# Patient Record
Sex: Male | Born: 1985 | Race: Black or African American | Hispanic: No
Health system: Southern US, Community
[De-identification: ages and names within clinical notes are randomized; demographics above are authoritative.]

## PROBLEM LIST (undated history)

## (undated) ENCOUNTER — Emergency Department (HOSPITAL_COMMUNITY): Payer: Self-pay | Source: Home / Self Care

## (undated) DIAGNOSIS — A64 Unspecified sexually transmitted disease: Secondary | ICD-10-CM

## (undated) HISTORY — PX: OTHER SURGICAL HISTORY: SHX169

## (undated) HISTORY — PX: MR LOWER LEG RIGHT (ARMC HX): HXRAD1785

---

## 2008-12-09 ENCOUNTER — Emergency Department (HOSPITAL_COMMUNITY): Admission: EM | Admit: 2008-12-09 | Discharge: 2008-12-09 | Payer: Self-pay | Admitting: Emergency Medicine

## 2010-06-12 ENCOUNTER — Emergency Department (HOSPITAL_COMMUNITY)
Admission: EM | Admit: 2010-06-12 | Discharge: 2010-06-12 | Payer: Self-pay | Source: Home / Self Care | Admitting: Emergency Medicine

## 2010-06-14 LAB — GC/CHLAMYDIA PROBE AMP, GENITAL
Chlamydia, DNA Probe: POSITIVE — AB
GC Probe Amp, Genital: POSITIVE — AB

## 2010-09-17 ENCOUNTER — Emergency Department (HOSPITAL_COMMUNITY)
Admission: EM | Admit: 2010-09-17 | Discharge: 2010-09-17 | Disposition: A | Discharge status: Home / Self Care | Payer: Self-pay | Attending: Emergency Medicine | Admitting: Emergency Medicine

## 2010-09-17 DIAGNOSIS — R51 Headache: Secondary | ICD-10-CM | POA: Insufficient documentation

## 2010-09-17 DIAGNOSIS — R0602 Shortness of breath: Secondary | ICD-10-CM | POA: Insufficient documentation

## 2010-09-17 DIAGNOSIS — R0989 Other specified symptoms and signs involving the circulatory and respiratory systems: Secondary | ICD-10-CM | POA: Insufficient documentation

## 2010-09-17 DIAGNOSIS — R0609 Other forms of dyspnea: Secondary | ICD-10-CM | POA: Insufficient documentation

## 2010-09-17 LAB — BASIC METABOLIC PANEL
BUN: 6 mg/dL (ref 6–23)
GFR calc Af Amer: >60 mL/min (ref >60)
GFR calc non Af Amer: >60 mL/min (ref >60)
Potassium: 3.8 mEq/L (ref 3.5–5.1)
Sodium: 135 mEq/L (ref 135–145)

## 2010-09-17 LAB — CBC
MCV: 91.2 fL (ref 78.0–100.0)
Platelets: 247 10*3/uL (ref 150–400)
RDW: 13.1 % (ref 11.5–15.5)
WBC: 9.2 10*3/uL (ref 4.0–10.5)

## 2010-09-17 LAB — DIFFERENTIAL
Basophils Absolute: 0 10*3/uL (ref 0.0–0.1)
Basophils Relative: 0 % (ref 0–1)
Eosinophils Absolute: 0.5 10*3/uL (ref 0.0–0.7)
Eosinophils Relative: 6 % — ABNORMAL HIGH (ref 0–5)

## 2012-01-27 ENCOUNTER — Encounter (HOSPITAL_COMMUNITY): Payer: Self-pay | Admitting: Emergency Medicine

## 2012-01-27 ENCOUNTER — Emergency Department (HOSPITAL_COMMUNITY)
Admission: EM | Admit: 2012-01-27 | Discharge: 2012-01-27 | Disposition: A | Discharge status: Home / Self Care | Payer: Self-pay | Attending: Emergency Medicine | Admitting: Emergency Medicine

## 2012-01-27 DIAGNOSIS — N342 Other urethritis: Secondary | ICD-10-CM | POA: Insufficient documentation

## 2012-01-27 MED ORDER — CEFTRIAXONE SODIUM 250 MG IJ SOLR
250.0000 mg | Freq: Once | INTRAMUSCULAR | Status: AC
  Administered 2012-01-27: 250 mg via INTRAMUSCULAR
  Filled 2012-01-27 (×2): qty 250

## 2012-01-27 MED ORDER — AZITHROMYCIN 1 G PO PACK
2.0000 g | PACK | Freq: Once | ORAL | Status: AC
  Administered 2012-01-27: 1 g via ORAL
  Filled 2012-01-27: qty 2

## 2012-01-27 NOTE — ED Notes (Signed)
Has d/c from penis since this am

## 2012-01-27 NOTE — ED Provider Notes (Signed)
History  This chart was scribed for Jones Skene, MD by Albertha Ghee Rifaie. This patient was seen in room TR09C/TR09C and the patient's care was started at 2:02 PM.  CSN: [id number]  Arrival date & time 01/27/12  1249   None     No chief complaint on file.   The history is provided by the patient. No language interpreter was used.    Jamie Lowe is a 26 y.o. male who presents to the Emergency Department asking for a STD test. He states that he had sexual intercourse with his friend last night and discovered that he had a penile discharge described as white associated with dysuria this morning. He denies testicular pain, fever, chills, diarrhea, cough, SOB, HA, and CP. He does not have a h/o chronic medical conditions. Pt is a current everyday smoker and occasional alcohol user.   History reviewed. No pertinent past medical history.  No past surgical history on file.  No family history on file.  History  Substance Use Topics  . Smoking status: Current Every Day Smoker  . Smokeless tobacco: Not on file  . Alcohol Use: Yes      Review of Systems  REVIEW OF SYSTEMS:   1.) CONSTITUTIONAL: No fever, chills or systemic signs of infection. No recent, unexplained weight changes.   2.) HEENT: No facial pain, sinus congestion or rhinorrhea is reported. Patient is denying any acute visual or hearing deficits. No sore throat or difficulty swallowing.  3.) NECK: No swelling or masses are reported.   4.) PULMONARY: No cough sputum production or shortness of breath was reported.   5.) CARDIAC: No palpitations, chest pain or pressure.   6.) ABDOMINAL: Denies abdominal pain, nausea, vomiting or diarrhea. No Hematochezia or melena.  7.) GENITOURINARY: Positive for dysuria and penile discharge.  8.) BACK: Denying any flank or CVA tenderness. No specific thoracic or lumbar pain.   9.) EXTREMITIES: Denying any extremity edema pitting or rash.   10.) SKIN: No rashes,  itching   Allergies  Review of patient's allergies indicates no known allergies.  Home Medications  No current outpatient prescriptions on file.  Triage Vitals: BP 130/83  Pulse 95  Temp 98.6 F (37 C)  Resp 16  SpO2 99%  Physical Exam   Nursing notes reviewed.  Electronic medical record reviewed. VITAL SIGNS:   Filed Vitals:   01/27/12 1255  BP: 130/83  Pulse: 95  Temp: 98.6 F (37 C)  Resp: 16  SpO2: 99%   CONSTITUTIONAL: Awake, oriented, appears non-toxic HENT: Atraumatic, normocephalic, oral mucosa pink and moist, airway patent. Nares patent without drainage. External ears normal. EYES: Conjunctiva clear, EOMI, PERRLA NECK: Trachea midline, non-tender, supple CARDIOVASCULAR: Normal heart rate, Normal rhythm, No murmurs, rubs, gallops PULMONARY/CHEST: Clear to auscultation, no rhonchi, wheezes, or rales. Symmetrical breath sounds. Non-tender. ABDOMINAL: Non-distended, soft, non-tender - no rebound or guarding.  BS normal. NEUROLOGIC: Non-focal, moving all four extremities, no gross sensory or motor deficits. EXTREMITIES: No clubbing, cyanosis, or edema SKIN: Warm, Dry, No erythema, No rash GU: Normal circumcised male, no drainage appreciated. No TTP of testicles, penis. No hernias. No tender LAD. No skin lesions.  ED Course  Procedures (including critical care time)  DIAGNOSTIC STUDIES: Oxygen Saturation is 99% on room air, normal by my interpretation.    COORDINATION OF CARE: 2:14PM-Discussed treatment plan with pt at bedside and pt agreed to plan.    Labs Reviewed - No data to display No results found.   No diagnosis found.  MDM  Jamie Lowe is a 26 y.o. male presenting with DC.  No torsion, epidydimitis, suspected.  Likely urethritis from STI.  Will treat for GC/Chlamydia and send sample for both. .I explained the diagnosis and have given explicit precautions to return to the ER including any other new or worsening symptoms. The patient  understands and accepts the medical plan as it's been dictated and I have answered their questions. Discharge instructions concerning home care and prescriptions have been given.  The patient is STABLE and is discharged to home in good condition.    I personally performed the services described in this documentation, which was scribed in my presence. The recorded information has been reviewed and considered. Jones Skene, M.D.      Jones Skene, MD 01/31/12 1339

## 2012-01-28 LAB — GC/CHLAMYDIA PROBE AMP, GENITAL: GC Probe Amp, Genital: POSITIVE — AB

## 2012-01-29 NOTE — ED Notes (Signed)
Attempted to contact patient. Phone number has been disconnected. Will send letter.

## 2012-01-29 NOTE — ED Notes (Signed)
+  Gonorrhea. Patient treated with Rocephin and Zithromax. Per protocol MD. [facility] faxed. °

## 2012-06-14 ENCOUNTER — Emergency Department (HOSPITAL_COMMUNITY)
Admission: EM | Admit: 2012-06-14 | Discharge: 2012-06-14 | Disposition: A | Discharge status: Home / Self Care | Payer: Self-pay | Attending: Emergency Medicine | Admitting: Emergency Medicine

## 2012-06-14 ENCOUNTER — Encounter (HOSPITAL_COMMUNITY): Payer: Self-pay

## 2012-06-14 DIAGNOSIS — M7650 Patellar tendinitis, unspecified knee: Secondary | ICD-10-CM | POA: Insufficient documentation

## 2012-06-14 DIAGNOSIS — M25569 Pain in unspecified knee: Secondary | ICD-10-CM | POA: Insufficient documentation

## 2012-06-14 DIAGNOSIS — F17200 Nicotine dependence, unspecified, uncomplicated: Secondary | ICD-10-CM | POA: Insufficient documentation

## 2012-06-14 DIAGNOSIS — M2550 Pain in unspecified joint: Secondary | ICD-10-CM | POA: Insufficient documentation

## 2012-06-14 DIAGNOSIS — N390 Urinary tract infection, site not specified: Secondary | ICD-10-CM | POA: Insufficient documentation

## 2012-06-14 DIAGNOSIS — R369 Urethral discharge, unspecified: Secondary | ICD-10-CM | POA: Insufficient documentation

## 2012-06-14 DIAGNOSIS — R300 Dysuria: Secondary | ICD-10-CM | POA: Insufficient documentation

## 2012-06-14 LAB — URINE MICROSCOPIC-ADD ON

## 2012-06-14 LAB — URINALYSIS, ROUTINE W REFLEX MICROSCOPIC
Ketones, ur: 15 mg/dL — AB
Nitrite: NEGATIVE
pH: 6 (ref 5.0–8.0)

## 2012-06-14 MED ORDER — HYDROCODONE-ACETAMINOPHEN 5-325 MG PO TABS: 1.0000 | ORAL_TABLET | ORAL | Status: DC | PRN

## 2012-06-14 MED ORDER — IBUPROFEN 800 MG PO TABS: 800.0000 mg | ORAL_TABLET | Freq: Three times a day (TID) | ORAL | Status: DC | PRN

## 2012-06-14 MED ORDER — LIDOCAINE HCL (PF) 1 % IJ SOLN
INTRAMUSCULAR | Status: AC
  Administered 2012-06-14: 1 mL
  Filled 2012-06-14: qty 5

## 2012-06-14 MED ORDER — CEFTRIAXONE SODIUM 250 MG IJ SOLR
250.0000 mg | Freq: Once | INTRAMUSCULAR | Status: AC
  Administered 2012-06-14: 250 mg via INTRAMUSCULAR
  Filled 2012-06-14: qty 250

## 2012-06-14 MED ORDER — HYDROCODONE-ACETAMINOPHEN 5-325 MG PO TABS
1.0000 | ORAL_TABLET | Freq: Once | ORAL | Status: AC
  Administered 2012-06-14: 1 via ORAL
  Filled 2012-06-14: qty 1

## 2012-06-14 MED ORDER — IBUPROFEN 400 MG PO TABS
800.0000 mg | ORAL_TABLET | Freq: Once | ORAL | Status: AC
  Administered 2012-06-14: 800 mg via ORAL
  Filled 2012-06-14: qty 2

## 2012-06-14 MED ORDER — SULFAMETHOXAZOLE-TMP DS 800-160 MG PO TABS: 1.0000 | ORAL_TABLET | Freq: Two times a day (BID) | ORAL | Status: DC

## 2012-06-14 MED ORDER — AZITHROMYCIN 250 MG PO TABS
1000.0000 mg | ORAL_TABLET | Freq: Once | ORAL | Status: AC
  Administered 2012-06-14: 1000 mg via ORAL
  Filled 2012-06-14: qty 4

## 2012-06-14 MED ORDER — SULFAMETHOXAZOLE-TMP DS 800-160 MG PO TABS
1.0000 | ORAL_TABLET | Freq: Once | ORAL | Status: AC
  Administered 2012-06-14: 1 via ORAL
  Filled 2012-06-14: qty 1

## 2012-06-14 NOTE — ED Provider Notes (Signed)
Medical screening examination/treatment/procedure(s) were performed by non-physician practitioner and as supervising physician I was immediately available for consultation/collaboration.   Joban Colledge L Shakelia Scrivner, MD 06/14/12 1810 

## 2012-06-14 NOTE — ED Notes (Signed)
Pt states he was seen at the clinic for discharge about a week or so ago and is still having penile discharge. Thinks maybe didn't get the right antibiotic. Also reports left knee pain with hx of.

## 2012-06-14 NOTE — ED Provider Notes (Signed)
History     CSN: [id number]  Arrival date & time 06/14/12  1246   First MD Initiated Contact with Patient 06/14/12 1336      Chief Complaint  Patient presents with  . Knee Pain  . Penile Discharge    (Consider location/radiation/quality/duration/timing/severity/associated sxs/prior treatment) Patient is a 27 y.o. male presenting with knee pain and penile discharge. The history is provided by the patient.  Knee Pain This is a new problem. The current episode started yesterday. Associated symptoms include arthralgias. Pertinent negatives include no abdominal pain, chills, fever or rash. Associated symptoms comments: Left knee pain without injury since yesterday similar to previous pain in the same knee affecting anterior aspect below the patella..  Penile Discharge This is a new problem. Associated symptoms include arthralgias. Pertinent negatives include no abdominal pain, chills, fever or rash. Associated symptoms comments: Penile discharge associated with urination for the past 2 weeks. [facility]2 [facility]". He continues to have discharge after urinating. He also reports some dribbling after urinating. No burning, abdominal pain, N, or fever. No testicular pain. He denies rectal pain. Marland Kitchen    History reviewed. No pertinent past medical history.  Past Surgical History  Procedure Date  . Knee sugery (left)     Pt states "I think it was a ligament tear"    History reviewed. No pertinent family history.  History  Substance Use Topics  . Smoking status: Current Some Day Smoker    Types: Cigars  . Smokeless tobacco: Not on file  . Alcohol Use: Yes      Review of Systems  Constitutional: Negative for fever and chills.  HENT: Negative.   Respiratory: Negative.   Cardiovascular: Negative.   Gastrointestinal: Negative.  Negative for abdominal pain.  Genitourinary: Positive for discharge and difficulty urinating. Negative for  testicular pain.       See HPI.  Musculoskeletal: Positive for arthralgias.  Skin: Negative.  Negative for rash and wound.  Psychiatric/Behavioral: Negative for confusion.    Allergies  Review of patient's allergies indicates no known allergies.  Home Medications  No current outpatient prescriptions on file.  BP 118/79  Pulse 81  Temp 97.7 F (36.5 C) (Oral)  Resp 20  SpO2 99%  Physical Exam  Constitutional: He is oriented to person, place, and time. He appears well-developed and well-nourished.  Cardiovascular: Intact distal pulses.   Abdominal: Soft. There is no tenderness.  Genitourinary:       No testicular pain or swelling. No scrotal redness. Circumcised penis with minimal white discharge present. Patient deferred rectal exam.   Musculoskeletal:       Left knee: tender to inferior patellar tendon with swelling. No discoloration, palpable abscess, no effusion. Joint stable. No calf or thigh tenderness.   Neurological: He is alert and oriented to person, place, and time.  Skin: Skin is warm and dry. No erythema. No pallor.    ED Course  Procedures (including critical care time)  Labs Reviewed  URINALYSIS, ROUTINE W REFLEX MICROSCOPIC - Abnormal; Notable for the following:    Color, Urine AMBER (*)  BIOCHEMICALS MAY BE AFFECTED BY COLOR   APPearance HAZY (*)     Specific Gravity, Urine 1.034 (*)     Bilirubin Urine SMALL (*)     Ketones, ur 15 (*)     Leukocytes, UA MODERATE (*)     All other components within normal limits  URINE MICROSCOPIC-ADD ON  GC/CHLAMYDIA PROBE  AMP   No results found.   No diagnosis found.  1. Penile discharge 2. uti 3. Left inferior patellar tendonitis  MDM  Cultures pending. Azithromax/rocephin given. Patient started on Septra DS for UTI - possible prostatitis, possible STD. Recommend urology follow up with persistent symptoms. Left knee pain does not reflect arthritis, doubt gonococcal infection, suspect inferior patellar  tendonitis.         Arnoldo Hooker, PA-C 06/14/12 1552

## 2012-06-15 LAB — GC/CHLAMYDIA PROBE AMP: GC Probe RNA: NEGATIVE

## 2013-07-14 ENCOUNTER — Encounter (HOSPITAL_COMMUNITY): Payer: Self-pay | Admitting: Emergency Medicine

## 2013-07-14 ENCOUNTER — Emergency Department (HOSPITAL_COMMUNITY)
Admission: EM | Admit: 2013-07-14 | Discharge: 2013-07-14 | Disposition: A | Discharge status: Home / Self Care | Payer: Self-pay | Attending: Emergency Medicine | Admitting: Emergency Medicine

## 2013-07-14 DIAGNOSIS — F17200 Nicotine dependence, unspecified, uncomplicated: Secondary | ICD-10-CM | POA: Insufficient documentation

## 2013-07-14 DIAGNOSIS — Z792 Long term (current) use of antibiotics: Secondary | ICD-10-CM | POA: Insufficient documentation

## 2013-07-14 DIAGNOSIS — L853 Xerosis cutis: Secondary | ICD-10-CM

## 2013-07-14 DIAGNOSIS — L259 Unspecified contact dermatitis, unspecified cause: Secondary | ICD-10-CM | POA: Insufficient documentation

## 2013-07-14 DIAGNOSIS — R369 Urethral discharge, unspecified: Secondary | ICD-10-CM | POA: Insufficient documentation

## 2013-07-14 DIAGNOSIS — Z8619 Personal history of other infectious and parasitic diseases: Secondary | ICD-10-CM | POA: Insufficient documentation

## 2013-07-14 LAB — RPR: RPR Ser Ql: NONREACTIVE

## 2013-07-14 LAB — HIV ANTIBODY (ROUTINE TESTING W REFLEX): HIV: NONREACTIVE

## 2013-07-14 MED ORDER — CEFTRIAXONE SODIUM 250 MG IJ SOLR
250.0000 mg | Freq: Once | INTRAMUSCULAR | Status: DC
  Filled 2013-07-14: qty 250

## 2013-07-14 MED ORDER — METRONIDAZOLE 500 MG PO TABS
2000.0000 mg | ORAL_TABLET | Freq: Once | ORAL | Status: AC
  Administered 2013-07-14: 2000 mg via ORAL
  Filled 2013-07-14: qty 4

## 2013-07-14 MED ORDER — AZITHROMYCIN 250 MG PO TABS
1000.0000 mg | ORAL_TABLET | Freq: Once | ORAL | Status: AC
  Administered 2013-07-14: 1000 mg via ORAL
  Filled 2013-07-14: qty 4

## 2013-07-14 MED ORDER — CETAPHIL MOISTURIZING EX CREA: TOPICAL_CREAM | CUTANEOUS | Status: DC | PRN

## 2013-07-14 MED ORDER — LIDOCAINE HCL (PF) 1 % IJ SOLN
INTRAMUSCULAR | Status: AC
  Filled 2013-07-14: qty 5

## 2013-07-14 NOTE — ED Notes (Signed)
Pt wanting to be tested for herpes.  PA aware, unable to swab if no lesions.  Pt having no lesions. Pt aware.

## 2013-07-14 NOTE — ED Notes (Signed)
Pt presents to department for evaluation of rash to buttocks and penile discharge. Ongoing for several days. Pt states itching to bilateral buttocks. States he wants to be checked for possible STD.

## 2013-07-14 NOTE — ED Provider Notes (Signed)
CSN: [id number]     Arrival date & time 07/14/13  1259 History  This chart was scribed for non-physician practitioner, Trixie Dredge, PA-C working with Lyanne Co, MD by Greggory Stallion, ED scribe. This patient was seen in room TR11C/TR11C and the patient's care was started at 1:47 PM.   Chief Complaint  Patient presents with  . Rash  . SEXUALLY TRANSMITTED DISEASE   The history is provided by the patient. No language interpreter was used.   HPI Comments: Jamie Lowe is a 28 y.o. male who presents to the Emergency Department complaining of a possible insect bite to his buttocks that he noticed 1.5 weeks ago. He states he now has an itchy rash to his buttocks. Pt has used an OTC cream for insect bites and coco butter with no relief. He is also complaining of clear penile discharge after urination. This began two days ago. Denies fever, abdominal pain, emesis, dysuria, testicular pain, testicular swelling. Pt has had gonorrhea in the past and states his symptoms are not similar. He wants treatment for chlamydia trichomonas.   No past medical history on file. Past Surgical History  Procedure Laterality Date  . Knee sugery (left)      Pt states "I think it was a ligament tear"   No family history on file. History  Substance Use Topics  . Smoking status: Current Some Day Smoker    Types: Cigars  . Smokeless tobacco: Not on file  . Alcohol Use: Yes    Review of Systems  Constitutional: Negative for fever.  Gastrointestinal: Negative for vomiting and abdominal pain.  Genitourinary: Positive for discharge. Negative for dysuria, scrotal swelling and testicular pain.  Skin: Positive for rash.  All other systems reviewed and are negative.   Allergies  Review of patient's allergies indicates no known allergies.  Home Medications   Current Outpatient Rx  Name  Route  Sig  Dispense  Refill  . HYDROcodone-acetaminophen (NORCO/VICODIN) 5-325 MG per tablet   Oral   Take 1 tablet by  mouth every 4 (four) hours as needed for pain.   6 tablet   0   . ibuprofen (ADVIL,MOTRIN) 800 MG tablet   Oral   Take 1 tablet (800 mg total) by mouth every 8 (eight) hours as needed for pain.   21 tablet   0   . sulfamethoxazole-trimethoprim (BACTRIM DS) 800-160 MG per tablet   Oral   Take 1 tablet by mouth 2 (two) times daily.   20 tablet   0    BP 108/73  Pulse 68  Temp(Src) 97.9 F (36.6 C) (Oral)  Resp 18  Wt 144 lb 1 oz (65.346 kg)  SpO2 100%  Physical Exam  Nursing note and vitals reviewed. Constitutional: He appears well-developed and well-nourished. No distress.  HENT:  Head: Normocephalic and atraumatic.  Neck: Neck supple.  Pulmonary/Chest: Effort normal.  Genitourinary:  Examination chaperoned by Greggory Stallion. No discharge. Circumcised.   Neurological: He is alert.  Skin: He is not diaphoretic.  Buttock with diffuse dry skin. No lesions. No erythema or edema. No discharge. No areas of tenderness, induration or fluctuance.     ED Course  Procedures (including critical care time)  DIAGNOSTIC STUDIES: Oxygen Saturation is 100% on RA, normal by my interpretation.    COORDINATION OF CARE: 1:50 PM-Discussed treatment plan which includes STD testing and treatment with pt at bedside and pt agreed to plan.   Labs Review Labs Reviewed - No data to display Imaging Review  No results found.   EKG Interpretation None      MDM   Final diagnoses:  Penile discharge  Dry skin dermatitis    Pt with itchy skin over bilateral buttocks, no rash, no apparent insect bites, no lesions noted.  Appears to have very dry skin.  Have recommended topical moisturizing cream.  Pt also with penile discharge x 2 days.  Pt has had STDs in the past and thinks this is most consistent with trichomonas or chlamydia, requests treatment for both.  STD panel sent.  Pt aware all results pending. D/C home.  Discussed findings, treatment, and follow up  with patient.  Pt given  return precautions.  Pt verbalizes understanding and agrees with plan.      I personally performed the services described in this documentation, which was scribed in my presence. The recorded information has been reviewed and is accurate.   ButlerEmily Jamaury Gumz, PA-C 07/14/13 1609

## 2013-07-14 NOTE — Discharge Instructions (Signed)
Read the information below.  You may return to the Emergency Department at any time for worsening condition or any new symptoms that concern you.If you develop high fevers, abdominal pain, testicular pain or scrotal swelling, uncontrolled vomiting, or are unable to tolerate fluids by mouth, return to the ER for a recheck.      Rash A rash is a change in the color or feel of your skin. There are many different types of rashes. You may have other problems along with your rash. HOME CARE  Avoid the thing that caused your rash.  Do not scratch your rash.  You may take cools baths to help stop itching.  Only take medicines as told by your doctor.  Keep all doctor visits as told. GET HELP RIGHT AWAY IF:   Your pain, puffiness (swelling), or redness gets worse.  You have a fever.  You have new or severe problems.  You have body aches, watery poop (diarrhea), or you throw up (vomit).  Your rash is not better after 3 days. MAKE SURE YOU:   Understand these instructions.  Will watch your condition.  Will get help right away if you are not doing well or get worse. Document Released: 10/20/2007 Document Revised: 07/26/2011 Document Reviewed: 02/15/2011 [facility] Patient Information 2014 [place], [place].

## 2013-07-15 NOTE — ED Provider Notes (Signed)
Medical screening examination/treatment/procedure(s) were performed by non-physician practitioner and as supervising physician I was immediately available for consultation/collaboration.   EKG Interpretation None        Lyanne CoKevin M Lamin Chandley, MD 07/15/13 [phone]

## 2013-07-16 LAB — GC/CHLAMYDIA PROBE AMP
CT Probe RNA: NEGATIVE
GC PROBE AMP APTIMA: NEGATIVE

## 2013-08-07 ENCOUNTER — Emergency Department (HOSPITAL_COMMUNITY)
Admission: EM | Admit: 2013-08-07 | Discharge: 2013-08-07 | Disposition: A | Discharge status: Home / Self Care | Payer: Self-pay | Attending: Emergency Medicine | Admitting: Emergency Medicine

## 2013-08-07 ENCOUNTER — Encounter (HOSPITAL_COMMUNITY): Payer: Self-pay | Admitting: Emergency Medicine

## 2013-08-07 DIAGNOSIS — B86 Scabies: Secondary | ICD-10-CM | POA: Insufficient documentation

## 2013-08-07 DIAGNOSIS — F17200 Nicotine dependence, unspecified, uncomplicated: Secondary | ICD-10-CM | POA: Insufficient documentation

## 2013-08-07 DIAGNOSIS — Z8619 Personal history of other infectious and parasitic diseases: Secondary | ICD-10-CM | POA: Insufficient documentation

## 2013-08-07 LAB — URINALYSIS, ROUTINE W REFLEX MICROSCOPIC
BILIRUBIN URINE: NEGATIVE
Glucose, UA: NEGATIVE mg/dL
Hgb urine dipstick: NEGATIVE
Ketones, ur: NEGATIVE mg/dL
NITRITE: NEGATIVE
PH: 6.5 (ref 5.0–8.0)
Protein, ur: NEGATIVE mg/dL
SPECIFIC GRAVITY, URINE: 1.027 (ref 1.005–1.030)
UROBILINOGEN UA: 1 mg/dL (ref 0.0–1.0)

## 2013-08-07 LAB — URINE MICROSCOPIC-ADD ON

## 2013-08-07 MED ORDER — HYDROXYZINE HCL 25 MG PO TABS
25.0000 mg | ORAL_TABLET | Freq: Once | ORAL | Status: AC
  Administered 2013-08-07: 25 mg via ORAL
  Filled 2013-08-07: qty 1

## 2013-08-07 MED ORDER — PERMETHRIN 5 % EX CREA: TOPICAL_CREAM | CUTANEOUS | Status: DC

## 2013-08-07 MED ORDER — HYDROXYZINE HCL 25 MG PO TABS: 25.0000 mg | ORAL_TABLET | Freq: Four times a day (QID) | ORAL | Status: DC

## 2013-08-07 NOTE — Discharge Instructions (Signed)
Use Permethrin cream as directed. INformational handout attached. Apply medication to entire body excluding face and hair. Leave in place for 8-12 hours, then rinse off. Repeat once if symptoms do not resolve. Bag up all clothing and bedding and tie in trash bag for 3 days then wash all in hot water and dry on high heat. Spray all furnishings and fabric in house with Nix that can be [facility] at [facility].   Scabies Scabies are small bugs (mites) that burrow under the skin and cause red bumps and severe itching. These bugs can only be seen with a microscope. Scabies are highly contagious. They can spread easily from person to person by direct contact. They are also spread through sharing clothing or linens that have the scabies mites living in them. It is not unusual for an entire family to become infected through shared towels, clothing, or bedding.  HOME CARE INSTRUCTIONS   Your caregiver may prescribe a cream or lotion to kill the mites. If cream is prescribed, massage the cream into the entire body from the neck to the bottom of both feet. Also massage the cream into the scalp and face if your child is less than 28 year old. Avoid the eyes and mouth. Do not wash your hands after application.  Leave the cream on for 8 to 12 hours. Your child should bathe or shower after the 8 to 12 hour application period. Sometimes it is helpful to apply the cream to your child right before bedtime.  One treatment is usually effective and will eliminate approximately 95% of infestations. For severe cases, your caregiver may decide to repeat the treatment in 1 week. Everyone in your household should be treated with one application of the cream.  New rashes or burrows should not appear within 24 to 48 hours after successful treatment. However, the itching and rash may last for 2 to 4 weeks after successful treatment. Your caregiver may prescribe a medicine to help with the itching or to help the rash go away more  quickly.  Scabies can live on clothing or linens for up to 3 days. All of your child's recently used clothing, towels, stuffed toys, and bed linens should be washed in hot water and then dried in a dryer for at least 20 minutes on high heat. Items that cannot be washed should be enclosed in a plastic bag for at least 3 days.  To help relieve itching, bathe your child in a cool bath or apply cool washcloths to the affected areas.  Your child may return to school after treatment with the prescribed cream. SEEK MEDICAL CARE IF:   The itching persists longer than 4 weeks after treatment.  The rash spreads or becomes infected. Signs of infection include red blisters or yellow-tan crust. Document Released: 05/03/2005 Document Revised: 07/26/2011 Document Reviewed: 09/11/2008 [facility] Patient Information 2014 [place], [place].

## 2013-08-07 NOTE — ED Provider Notes (Signed)
CSN: [id number]     Arrival date & time 08/07/13  1650 History  This chart was scribed for non-physician practitioner Cristobal Goldmann PA-C working with Enid Skeens, MD by Dorothey Baseman, ED Scribe. This patient was seen in room TR08C/TR08C and the patient's care was started at 5:24 PM.    Chief Complaint  Patient presents with  . Rash   The history is provided by the patient. No language interpreter was used.   HPI Comments: Jamie Lowe is a 28 y.o. male who presents to the Emergency Department complaining of an itching rash to the bilateral forearms, hands, thighs, and buttocks that he states has been ongoing for the past few weeks. He states that he believes it may be due to insect bites. He denies any exposure to individuals with similar rash, staying in new environments, prolonged exposure to the outdoors, or recent changes in at-home products (i.e. Soaps, detergents, etc.). Patient reports that he does wash his clothes at another individual's home where she has pets. Patient was seen here on 07/15/2013 for similar complaints and there were no signs of a rash at that time. Symptoms were believed to be due to dry skin and patient was advised of symptomatic care. He states that he has been using Cetaphil lotion on the areas at home without relief. He denies fever, chills, cough, congestion.   Patient is also complaining of some clear penile discharge that he states has been ongoing for the past 3 weeks and only presents with urination. Patient was seen here on 07/15/2013 for similar complaints and received STD testing and was treated for trichomonas and chlamydia/gonorrhea, at his request based on his prior history of both. He reports that his sexual partner was not tested or treated for any STDs. Patient has no other pertinent medical history. Patient denies any dysuria.   History reviewed. No pertinent past medical history. Past Surgical History  Procedure Laterality Date  . Knee sugery (left)       Pt states "I think it was a ligament tear"   History reviewed. No pertinent family history. History  Substance Use Topics  . Smoking status: Current Some Day Smoker    Types: Cigars  . Smokeless tobacco: Not on file  . Alcohol Use: Yes    Review of Systems  A complete 10 system review of systems was obtained and all systems are negative except as noted in the HPI and PMH.    Allergies  Review of patient's allergies indicates no known allergies.  Home Medications   Current Outpatient Rx  Name  Route  Sig  Dispense  Refill  . cetaphil (CETAPHIL) cream   Topical   Apply 1 application topically as needed (for itching).         . hydrOXYzine (ATARAX/VISTARIL) 25 MG tablet   Oral   Take 1 tablet (25 mg total) by mouth every 6 (six) hours.   12 tablet   0   . permethrin (ELIMITE) 5 % cream      Apply to entire body other than face - let sit for 14 hours then wash off, may repeat in 1 week if still having symptoms   60 g   1    Triage Vitals: BP 112/78  Pulse 92  Temp(Src) 98.4 F (36.9 C) (Oral)  Resp 18  Wt 147 lb (66.679 kg)  SpO2 95%  Physical Exam  Nursing note and vitals reviewed. Constitutional: He is oriented to person, place, and time. He appears well-developed  and well-nourished. No distress.  HENT:  Head: Normocephalic and atraumatic.  Eyes: Conjunctivae are normal.  Neck: No tracheal deviation present.  Pulmonary/Chest: Effort normal. No respiratory distress.  Genitourinary:  No penile discharge.  Exam was performed with pt's permission. Chaperone (scribe) was present. The exam was performed with no discomfort or complications.    Musculoskeletal: Normal range of motion. He exhibits no edema.  Neurological: He is alert and oriented to person, place, and time.  Skin: Skin is warm and dry. Rash noted. He is not diaphoretic.  Papular, scattered lesions localized to the bilateral buttocks, medial thighs bilaterally, webbing of the fingers,  bilateral wrists.   Psychiatric: He has a normal mood and affect. His behavior is normal.    ED Course  Procedures (including critical care time)  DIAGNOSTIC STUDIES: Oxygen Saturation is 95% on room air, normal by my interpretation.    COORDINATION OF CARE: 5:30 PM- Discussed that the rash appears to be due to scabies and advised of techniques to minimize the potential for reexposure. Will discharge patient with medication to manage symptoms. Discussed that additional STD testing will not be necessary at this time. Advised [facility] and to have his partner tested/treated. Discussed treatment plan with patient at bedside and patient verbalized agreement.     Labs Review Labs Reviewed  URINALYSIS, ROUTINE W REFLEX MICROSCOPIC   Imaging Review No results found.   EKG Interpretation None      MDM   Final diagnoses:  Scabies    Patient afebrile with normal VS.  Rash consistent with scabies.  No penile discharge on exam. Patient recently treated for trich, and G/C on 07/14/13 due to similar sxs. Patient's cultures were negative at that time. RPR and HIV testing negative at that time as well. No evidence of urethritis on my exam. Patient advised to [facility] with [facility] for further testing, and advised to have his partner tested and treated as well. Discussed treatment of scabies and prevention of recurrence in detail. Patient confirms understanding. Patient invited to return to ED should his sxs not resolve with treatment.   Meds given in ED:  Medications  hydrOXYzine (ATARAX/VISTARIL) tablet 25 mg (not administered)    New Prescriptions   HYDROXYZINE (ATARAX/VISTARIL) 25 MG TABLET    Take 1 tablet (25 mg total) by mouth every 6 (six) hours.   PERMETHRIN (ELIMITE) 5 % CREAM    Apply to entire body other than face - let sit for 14 hours then wash off, may repeat in 1 week if still having symptoms    I personally performed the  services described in this documentation, which was scribed in my presence. The recorded information has been reviewed and is accurate.   Rudene AndaJacob Gray Macklen Wilhoite, PA-C 08/08/13 [phone]

## 2013-08-07 NOTE — ED Notes (Addendum)
Pt reports having rash and itching to thighs and buttocks area, thinks its possible bug bites. Also has rash and itching to hands and forearms. No acute distress noted at triage. Also states he was seen in past for std and thinks he was treated for the wrong std and still has penile discharge.

## 2013-08-09 NOTE — ED Provider Notes (Signed)
Medical screening examination/treatment/procedure(s) were performed by non-physician practitioner and as supervising physician I was immediately available for consultation/collaboration.   EKG Interpretation None        Enid SkeensJoshua M Purva Vessell, MD 08/09/13 1246

## 2013-09-24 ENCOUNTER — Encounter (HOSPITAL_COMMUNITY): Payer: Self-pay | Admitting: Emergency Medicine

## 2013-09-24 ENCOUNTER — Emergency Department (HOSPITAL_COMMUNITY)
Admission: EM | Admit: 2013-09-24 | Discharge: 2013-09-25 | Discharge status: Against Medical Advice | Payer: Self-pay | Attending: Emergency Medicine | Admitting: Emergency Medicine

## 2013-09-24 DIAGNOSIS — L02519 Cutaneous abscess of unspecified hand: Secondary | ICD-10-CM | POA: Insufficient documentation

## 2013-09-24 DIAGNOSIS — L02512 Cutaneous abscess of left hand: Secondary | ICD-10-CM

## 2013-09-24 DIAGNOSIS — F17200 Nicotine dependence, unspecified, uncomplicated: Secondary | ICD-10-CM | POA: Insufficient documentation

## 2013-09-24 DIAGNOSIS — L03119 Cellulitis of unspecified part of limb: Principal | ICD-10-CM

## 2013-09-24 NOTE — ED Notes (Signed)
Pt reports callus to left hand for two weeks "and I have been picking at with nail clippers." Pt states for the last two days now has pain to that area. Area noted to be red. Denies fever/chills.

## 2013-09-25 ENCOUNTER — Emergency Department (HOSPITAL_COMMUNITY): Payer: Self-pay

## 2013-09-25 ENCOUNTER — Encounter (HOSPITAL_COMMUNITY): Payer: Self-pay | Admitting: Emergency Medicine

## 2013-09-25 ENCOUNTER — Emergency Department (HOSPITAL_COMMUNITY)
Admission: EM | Admit: 2013-09-25 | Discharge: 2013-09-25 | Disposition: A | Discharge status: Home / Self Care | Payer: Self-pay | Attending: Emergency Medicine | Admitting: Emergency Medicine

## 2013-09-25 DIAGNOSIS — L84 Corns and callosities: Secondary | ICD-10-CM | POA: Insufficient documentation

## 2013-09-25 DIAGNOSIS — L03119 Cellulitis of unspecified part of limb: Principal | ICD-10-CM

## 2013-09-25 DIAGNOSIS — L02519 Cutaneous abscess of unspecified hand: Secondary | ICD-10-CM | POA: Insufficient documentation

## 2013-09-25 DIAGNOSIS — L03114 Cellulitis of left upper limb: Secondary | ICD-10-CM

## 2013-09-25 DIAGNOSIS — F17200 Nicotine dependence, unspecified, uncomplicated: Secondary | ICD-10-CM | POA: Insufficient documentation

## 2013-09-25 MED ORDER — HYDROCODONE-ACETAMINOPHEN 5-325 MG PO TABS: 1.0000 | ORAL_TABLET | ORAL | Status: DC | PRN

## 2013-09-25 MED ORDER — CEPHALEXIN 500 MG PO CAPS: 500.0000 mg | ORAL_CAPSULE | Freq: Four times a day (QID) | ORAL | Status: DC

## 2013-09-25 MED ORDER — HYDROCODONE-ACETAMINOPHEN 5-325 MG PO TABS
2.0000 | ORAL_TABLET | Freq: Once | ORAL | Status: AC
  Administered 2013-09-25: 2 via ORAL
  Filled 2013-09-25: qty 2

## 2013-09-25 MED ORDER — SULFAMETHOXAZOLE-TRIMETHOPRIM 800-160 MG PO TABS: 1.0000 | ORAL_TABLET | Freq: Two times a day (BID) | ORAL | Status: DC

## 2013-09-25 MED ORDER — CLINDAMYCIN HCL 150 MG PO CAPS
300.0000 mg | ORAL_CAPSULE | Freq: Once | ORAL | Status: AC
  Administered 2013-09-25: 300 mg via ORAL
  Filled 2013-09-25: qty 2

## 2013-09-25 NOTE — Discharge Instructions (Signed)
Take Bactrim and Keflex as directed until gone. Take Vicodin as needed for pain. Follow up with Dr. Amanda PeaGramig if symptoms do not improve. Refer to attached documents for more information. Return to the ED with worsening or concerning symptoms.

## 2013-09-25 NOTE — ED Notes (Signed)
Pt had callus on left hand and thinks may be infected from trying to use file to cut the callus out.  Pt has significant swelling.

## 2013-09-25 NOTE — ED Notes (Signed)
Pt states he has to leave due to his child about to be born. Onalee Huaavid NP notified. Pt was advised to follow up as soon as possible further assessment of his hand.

## 2013-09-25 NOTE — ED Provider Notes (Signed)
Medical screening examination/treatment/procedure(s) were performed by non-physician practitioner and as supervising physician I was immediately available for consultation/collaboration.   EKG Interpretation None       Jaleea Alesi R. Taiwana Willison, MD 09/25/13 2325 

## 2013-09-25 NOTE — ED Provider Notes (Signed)
Medical screening examination/treatment/procedure(s) were performed by non-physician practitioner and as supervising physician I was immediately available for consultation/collaboration.  Lyanne CoKevin M Chessica Audia, MD 09/25/13 [phone]

## 2013-09-25 NOTE — ED Provider Notes (Signed)
CSN: [id number]     Arrival date & time 09/25/13  1318 History  This chart was scribed for non-physician practitioner working with Shanna CiscoMegan E Docherty, MD, by Jarvis Morganaylor Ferguson, ED Scribe. This patient was seen in room TR09C/TR09C and the patient's care was started at 3:19 PM.     Chief Complaint  Patient presents with  . Hand Pain     Patient is a 28 y.o. male presenting with hand pain. The history is provided by the patient. No language interpreter was used.  Hand Pain This is a new problem. The current episode started more than 2 days ago. The problem has been gradually worsening.   HPI Comments: Jamie Lowe is a 28 y.o. male who presents to the Emergency Department complaining of constant, gradually worsening, moderate, left hand pain onset 7 days ago. Patient states that he had a callus on his left hand and he used a file to cut the callus out. Patient is worried that the hand could possibly be infected. Patient states that he associated swelling and numbness in his left thumb. Patient states that he is able to move his thumb. Patient state that he applied ice to the thumb with no relief. Pt denies any fever.    History reviewed. No pertinent past medical history. Past Surgical History  Procedure Laterality Date  . Knee sugery (left)      Pt states "I think it was a ligament tear"   No family history on file. History  Substance Use Topics  . Smoking status: Current Some Day Smoker    Types: Cigars  . Smokeless tobacco: Not on file  . Alcohol Use: Yes    Review of Systems  Constitutional: Negative for fever.  Musculoskeletal: Positive for arthralgias (Left thumb pain) and joint swelling (swelling of left thumb).  All other systems reviewed and are negative.     Allergies  Review of patient's allergies indicates no known allergies.  Home Medications   Prior to Admission medications   Not on File   Triage Vitals: BP 144/86  Pulse 89  Temp(Src) 98.7 F (37.1 C)  (Oral)  Resp 18  Ht 5\' 8"  (1.727 m)  Wt 147 lb (66.679 kg)  BMI 22.36 kg/m2  SpO2 98%  Physical Exam  Nursing note and vitals reviewed. Constitutional: He is oriented to person, place, and time. He appears well-developed and well-nourished. No distress.  HENT:  Head: Normocephalic and atraumatic.  Right Ear: External ear normal.  Left Ear: External ear normal.  Nose: Nose normal.  Mouth/Throat: Oropharynx is clear and moist.  Eyes: Conjunctivae are normal.  Neck: Normal range of motion. Neck supple.  Cardiovascular: Normal rate.   Pulmonary/Chest: Effort normal.  Abdominal: Soft.  Musculoskeletal: Normal range of motion.  Limited ROM of left hand and pain due to swelling  Neurological: He is alert and oriented to person, place, and time.  Intact distal sensation of fingers of left hand  Skin: Skin is warm and dry. He is not diaphoretic. There is erythema.  Erythema and edema noted to the thenar aspect of left hand and web space between thumb and first digit. There is a callus noted at base of left thumb at the palmer aspect. The area is tender to palpate. No obvious deformity or drainable abscess noted. No streaking noted.  Psychiatric: He has a normal mood and affect.    ED Course  Procedures (including critical care time)  DIAGNOSTIC STUDIES: Oxygen Saturation is 98% on RA, normal by my interpretation.  COORDINATION OF CARE: 1:30 PM- Will order diagnostic imaging of left hand. Pt advised of plan for treatment and pt agrees.  3:29 PM-  Will discharge pt with Keflex, Vicodin and Septra DS. Pt advised of plan for treatment and pt agrees.  Labs Review Labs Reviewed - No data to display  Imaging Review Dg Hand Complete Left  09/25/2013   CLINICAL DATA:  Hand pain and swelling first metacarpal for several days after manipulating callus in this area  EXAM: LEFT HAND - COMPLETE 3+ VIEW  COMPARISON:  None.  FINDINGS: There is no evidence of fracture or dislocation. There is  no evidence of arthropathy or other focal bone abnormality. Soft tissues are unremarkable.  IMPRESSION: Negative.   Electronically Signed   By: Esperanza Heiraymond  Rubner M.D.   On: 09/25/2013 14:02     EKG Interpretation None      MDM   Final diagnoses:  Cellulitis of left hand   3:58 PM Patient will have bactrim and keflex for cellulitis. Patient will have Vicodin for pain. Patient has no abscess noted. Patient advised to follow up with hand surgery for further evaluation if symptoms worsen. Patient instructed to return to the ED with worsening or concerning symptoms.   I personally performed the services described in this documentation, which was scribed in my presence. The recorded information has been reviewed and is accurate.    Emilia BeckKaitlyn Kayne Yuhas, PA-C 09/25/13 1604

## 2013-09-25 NOTE — ED Provider Notes (Signed)
CSN: [id number]     Arrival date & time 09/24/13  2224 History   First MD Initiated Contact with Patient 09/24/13 2356     Chief Complaint  Patient presents with  . Hand Problem     (Consider location/radiation/quality/duration/timing/severity/associated sxs/prior Treatment) HPI Comments: Patient reports self-treating a callus on the thumb of his left hand with nail clippers, and has since developed swelling and pain to the area.     Patient is a 28 y.o. male presenting with hand pain.  Hand Pain This is a new problem. The current episode started in the past 7 days. The problem occurs constantly. The problem has been gradually worsening.    History reviewed. No pertinent past medical history. Past Surgical History  Procedure Laterality Date  . Knee sugery (left)      Pt states "I think it was a ligament tear"   No family history on file. History  Substance Use Topics  . Smoking status: Current Some Day Smoker    Types: Cigars  . Smokeless tobacco: Not on file  . Alcohol Use: Yes    Review of Systems  Skin: Positive for wound.  All other systems reviewed and are negative.     Allergies  Review of patient's allergies indicates no known allergies.  Home Medications   Prior to Admission medications   Not on File   BP 109/96  Pulse 66  Temp(Src) 98.4 F (36.9 C) (Oral)  Resp 16  SpO2 99% Physical Exam  Nursing note and vitals reviewed. Constitutional: He is oriented to person, place, and time. He appears well-developed and well-nourished.  HENT:  Head: Normocephalic.  Eyes: Conjunctivae are normal. Pupils are equal, round, and reactive to light.  Neck: Normal range of motion.  Cardiovascular: Normal rate and regular rhythm.   Pulmonary/Chest: Effort normal and breath sounds normal.  Abdominal: Soft.  Musculoskeletal: He exhibits edema and tenderness.  Swelling, erythema at base of left thumb.  Lymphadenopathy:    He has no cervical adenopathy.   Neurological: He is alert and oriented to person, place, and time.  Skin: Skin is warm and dry.  Psychiatric: He has a normal mood and affect. His behavior is normal. Judgment and thought content normal.    ED Course  Procedures (including critical care time) Labs Review Labs Reviewed - No data to display  Imaging Review No results found.   EKG Interpretation None     Suspect deep tissue abscess of left hand at base of thumb. MDM   Final diagnoses:  None    Patient left AMA before completion of diagnostic and treatment procedures.    Jimmye Normanavid John Shadee Rathod, NP 09/25/13 [phone]

## 2014-02-22 ENCOUNTER — Emergency Department (HOSPITAL_COMMUNITY)
Admission: EM | Admit: 2014-02-22 | Discharge: 2014-02-22 | Disposition: A | Discharge status: Home / Self Care | Payer: Self-pay | Attending: Emergency Medicine | Admitting: Emergency Medicine

## 2014-02-22 ENCOUNTER — Encounter (HOSPITAL_COMMUNITY): Payer: Self-pay | Admitting: Emergency Medicine

## 2014-02-22 DIAGNOSIS — Z202 Contact with and (suspected) exposure to infections with a predominantly sexual mode of transmission: Secondary | ICD-10-CM

## 2014-02-22 DIAGNOSIS — R590 Localized enlarged lymph nodes: Secondary | ICD-10-CM | POA: Insufficient documentation

## 2014-02-22 DIAGNOSIS — Z720 Tobacco use: Secondary | ICD-10-CM | POA: Insufficient documentation

## 2014-02-22 DIAGNOSIS — Z113 Encounter for screening for infections with a predominantly sexual mode of transmission: Secondary | ICD-10-CM | POA: Insufficient documentation

## 2014-02-22 DIAGNOSIS — R369 Urethral discharge, unspecified: Secondary | ICD-10-CM | POA: Insufficient documentation

## 2014-02-22 DIAGNOSIS — R300 Dysuria: Secondary | ICD-10-CM | POA: Insufficient documentation

## 2014-02-22 DIAGNOSIS — N508 Other specified disorders of male genital organs: Secondary | ICD-10-CM | POA: Insufficient documentation

## 2014-02-22 MED ORDER — STERILE WATER FOR INJECTION IJ SOLN
INTRAMUSCULAR | Status: AC
  Administered 2014-02-22: 1 mL via INTRAMUSCULAR
  Filled 2014-02-22: qty 10

## 2014-02-22 MED ORDER — METRONIDAZOLE 500 MG PO TABS: 500.0000 mg | ORAL_TABLET | Freq: Two times a day (BID) | ORAL | Status: DC

## 2014-02-22 MED ORDER — AZITHROMYCIN 250 MG PO TABS
1000.0000 mg | ORAL_TABLET | Freq: Once | ORAL | Status: AC
  Administered 2014-02-22: 1000 mg via ORAL
  Filled 2014-02-22: qty 4

## 2014-02-22 MED ORDER — CEFTRIAXONE SODIUM 250 MG IJ SOLR
250.0000 mg | Freq: Once | INTRAMUSCULAR | Status: AC
  Administered 2014-02-22: 250 mg via INTRAMUSCULAR
  Filled 2014-02-22: qty 250

## 2014-02-22 NOTE — ED Provider Notes (Signed)
CSN: [id number]     Arrival date & time 02/22/14  [id number] History  This chart was scribed for non-physician practitioner, [name], PA-C, working with [name], MD by [name], ED Scribe. This patient was seen in room TR09C/TR09C and the patient's care was started at 10:35 AM.   Chief Complaint  Patient presents with  . SEXUALLY TRANSMITTED DISEASE   The history is provided by the patient. No language interpreter was used.  HPI Comments: Jamie Lowe is a 28 y.o. male who presents to the Emergency Department for STD exposure. Pt states that his partner was diagnosed with trichomoniasis yesterday. He reports associated clear penile discharge for the past 3 days.  He denies fever, chills, nausea, vomiting. Pt reports h/o STD. Pt states that he has been with the same partner for a few months. He reports condom use a few months ago but denies use now.   History reviewed. No pertinent past medical history. Past Surgical History  Procedure Laterality Date  . Knee sugery (left)      Pt states "I think it was a ligament tear"   History reviewed. No pertinent family history. History  Substance Use Topics  . Smoking status: Current Some Day Smoker    Types: Cigars  . Smokeless tobacco: Not on file  . Alcohol Use: Yes    Review of Systems  Constitutional: Negative for fever and chills.  Gastrointestinal: Negative for nausea and vomiting.  Genitourinary: Positive for dysuria, discharge and testicular pain.  All other systems reviewed and are negative.  Allergies  Review of patient's allergies indicates no known allergies.  Home Medications   Prior to Admission medications   Not on File   Triage Vitals: BP 119/68  Pulse 68  Temp(Src) 97.7 F (36.5 C) (Oral)  Resp 18  SpO2 100% Physical Exam  Nursing note and vitals reviewed. Constitutional: He is oriented to person, place, and time. He appears well-developed and well-nourished. No distress.  HENT:  Head:  Normocephalic and atraumatic.  Eyes: Conjunctivae and EOM are normal.  Neck: Neck supple. No tracheal deviation present.  Cardiovascular: Normal rate and regular rhythm.   Pulmonary/Chest: Effort normal and breath sounds normal. No respiratory distress.  Genitourinary: Testes normal. Right testis shows no mass, no swelling and no tenderness. Left testis shows no mass, no swelling and no tenderness. No penile erythema. No discharge found.  Chaperone present   Musculoskeletal: Normal range of motion.  Lymphadenopathy:       Right: Inguinal adenopathy present.       Left: Inguinal adenopathy present.  Neurological: He is alert and oriented to person, place, and time.  Skin: Skin is warm and dry.  Psychiatric: He has a normal mood and affect. His behavior is normal.   ED Course  Procedures (including critical care time) DIAGNOSTIC STUDIES: Oxygen Saturation is 100% on RA, normal by my interpretation.    COORDINATION OF CARE: 10:40 AM-Discussed treatment plan which includes diagnostic lab work with pt at bedside and pt agreed to plan.   Labs Review Labs Reviewed  GC/CHLAMYDIA PROBE AMP   Imaging Review No results found.   EKG Interpretation None      MDM   Final diagnoses:  None   Patient presenting today requesting STD check.  He reports that his girlfriend was diagnosed with Trich recently.  No scrotal pain or swelling on exam.  No penile lesions.  GC/Chlamydia pending.  Patient declined HIV testing.  Patient treated with Flagyl for Trich.  Stable for discharge.  Return precautions given.  I personally performed the services described in this documentation, which was scribed in my presence. The recorded information has been reviewed and is accurate.    Santiago GladHeather Tierrah Anastos, PA-C 02/22/14 1148

## 2014-02-22 NOTE — ED Notes (Signed)
Reports being exposed to std and now having penile discharge.

## 2014-02-22 NOTE — Discharge Instructions (Signed)
Do not drink alcohol while taking Flagyl.

## 2014-02-22 NOTE — ED Provider Notes (Signed)
Medical screening examination/treatment/procedure(s) were performed by non-physician practitioner and as supervising physician I was immediately available for consultation/collaboration.   EKG Interpretation None      Dayle Sherpa, MD, FACEP   Jaylenn Altier L Nakota Elsen, MD 02/22/14 1511 

## 2014-02-22 NOTE — ED Notes (Signed)
Pt left head phones in room. Pt called and made aware. "Thrown them away because I don't feel like walking back". Offered to place in lost and found or at front desk. Reports to trash them.

## 2014-02-23 LAB — GC/CHLAMYDIA PROBE AMP
CT Probe RNA: NEGATIVE
GC Probe RNA: NEGATIVE

## 2014-02-26 ENCOUNTER — Telehealth (HOSPITAL_BASED_OUTPATIENT_CLINIC_OR_DEPARTMENT_OTHER): Payer: Self-pay | Admitting: Emergency Medicine

## 2014-05-12 ENCOUNTER — Emergency Department (HOSPITAL_COMMUNITY)
Admission: EM | Admit: 2014-05-12 | Discharge: 2014-05-12 | Disposition: A | Discharge status: Home / Self Care | Payer: Self-pay | Attending: Emergency Medicine | Admitting: Emergency Medicine

## 2014-05-12 ENCOUNTER — Encounter (HOSPITAL_COMMUNITY): Payer: Self-pay

## 2014-05-12 DIAGNOSIS — R300 Dysuria: Secondary | ICD-10-CM | POA: Insufficient documentation

## 2014-05-12 DIAGNOSIS — Z8619 Personal history of other infectious and parasitic diseases: Secondary | ICD-10-CM | POA: Insufficient documentation

## 2014-05-12 DIAGNOSIS — R590 Localized enlarged lymph nodes: Secondary | ICD-10-CM | POA: Insufficient documentation

## 2014-05-12 DIAGNOSIS — Z720 Tobacco use: Secondary | ICD-10-CM | POA: Insufficient documentation

## 2014-05-12 DIAGNOSIS — R369 Urethral discharge, unspecified: Secondary | ICD-10-CM | POA: Insufficient documentation

## 2014-05-12 DIAGNOSIS — N4889 Other specified disorders of penis: Secondary | ICD-10-CM | POA: Insufficient documentation

## 2014-05-12 DIAGNOSIS — Z792 Long term (current) use of antibiotics: Secondary | ICD-10-CM | POA: Insufficient documentation

## 2014-05-12 HISTORY — DX: Unspecified sexually transmitted disease: A64

## 2014-05-12 LAB — URINALYSIS, ROUTINE W REFLEX MICROSCOPIC
Bilirubin Urine: NEGATIVE
GLUCOSE, UA: NEGATIVE mg/dL
Ketones, ur: NEGATIVE mg/dL
NITRITE: NEGATIVE
Protein, ur: NEGATIVE mg/dL
SPECIFIC GRAVITY, URINE: 1.023 (ref 1.005–1.030)
Urobilinogen, UA: 0.2 mg/dL (ref 0.0–1.0)
pH: 6.5 (ref 5.0–8.0)

## 2014-05-12 LAB — URINE MICROSCOPIC-ADD ON

## 2014-05-12 MED ORDER — CEFTRIAXONE SODIUM 250 MG IJ SOLR
250.0000 mg | Freq: Once | INTRAMUSCULAR | Status: AC
  Administered 2014-05-12: 250 mg via INTRAMUSCULAR
  Filled 2014-05-12: qty 250

## 2014-05-12 MED ORDER — DOXYCYCLINE HYCLATE 100 MG PO CAPS: 100.0000 mg | ORAL_CAPSULE | Freq: Two times a day (BID) | ORAL | Status: DC

## 2014-05-12 MED ORDER — LIDOCAINE HCL (PF) 1 % IJ SOLN
INTRAMUSCULAR | Status: AC
  Filled 2014-05-12: qty 5

## 2014-05-12 MED ORDER — AZITHROMYCIN 250 MG PO TABS
1000.0000 mg | ORAL_TABLET | Freq: Once | ORAL | Status: AC
  Administered 2014-05-12: 1000 mg via ORAL
  Filled 2014-05-12: qty 4

## 2014-05-12 MED ORDER — LIDOCAINE HCL (PF) 1 % IJ SOLN
0.9000 mL | Freq: Once | INTRAMUSCULAR | Status: AC
  Administered 2014-05-12: 0.9 mL

## 2014-05-12 NOTE — ED Notes (Signed)
Pt states he believes he was exposed to an STD on Friday.  Pt reports hx of Clamydia/Ghonorrhea.  Pt endorses clear white d/c from penis and burning urination.

## 2014-05-12 NOTE — ED Provider Notes (Signed)
CSN: [id number]     Arrival date & time 05/12/14  1103 History   This chart was scribed for non-physician practitioner, Fayrene HelperBowie Titus Drone, PA-C working with Warnell Foresterrey Wofford, MD, by Abel PrestoKara Demonbreun, ED Scribe. This patient was seen in room TR09C/TR09C and the patient's care was started at 12:00 PM.     Chief Complaint  Patient presents with  . Exposure to STD     Patient is a 28 y.o. male presenting with STD exposure. The history is provided by the patient. No language interpreter was used.  Exposure to STD Pertinent negatives include no abdominal pain.    HPI Comments: Jamie Lowe is a 28 y.o. male who presents to the Emergency Department complaining of dysuria with onset this morning.  Pt notes he last had intercourse 3 days ago. Pt notes associated discharge, abdominal discomfort, and penile pain, but denies any rash. Pt has PMHx of trichomoniasis.  He has had STD testing in the last 6 months but has had more than 5 sexual partners in that time frame, some unprotected. Pt denies testicular pain, fever, and abdominal pain.    Past Medical History  Diagnosis Date  . STD (male)    Past Surgical History  Procedure Laterality Date  . Knee sugery (left)      Pt states "I think it was a ligament tear"   No family history on file. History  Substance Use Topics  . Smoking status: Current Some Day Smoker    Types: Cigars  . Smokeless tobacco: Not on file  . Alcohol Use: Yes    Review of Systems  Constitutional: Negative for fever.  Gastrointestinal: Negative for abdominal pain.  Genitourinary: Positive for dysuria, discharge and penile pain. Negative for testicular pain.      Allergies  Review of patient's allergies indicates no known allergies.  Home Medications   Prior to Admission medications   Medication Sig Start Date End Date Taking? Authorizing Provider  metroNIDAZOLE (FLAGYL) 500 MG tablet Take 1 tablet (500 mg total) by mouth 2 (two) times daily. 02/22/14   Heather  Laisure, PA-C   BP 116/85 mmHg  Pulse 74  Temp(Src) 97.8 F (36.6 C) (Oral)  Resp 15  Ht 5\' 8"  (1.727 m)  Wt 145 lb (65.772 kg)  BMI 22.05 kg/m2  SpO2 98% Physical Exam  Constitutional: He is oriented to person, place, and time. He appears well-developed and well-nourished.  HENT:  Head: Normocephalic.  Eyes: Conjunctivae are normal.  Neck: Normal range of motion. Neck supple.  Pulmonary/Chest: Effort normal.  Abdominal: Hernia confirmed negative in the right inguinal area and confirmed negative in the left inguinal area.  Genitourinary: Testes normal. Circumcised. Discharge (at urethral meatus) found.   no rash on penile shaft Testicle with normal lie  Musculoskeletal: Normal range of motion.  Lymphadenopathy:       Right: Inguinal adenopathy present.       Left: Inguinal adenopathy present.  Neurological: He is alert and oriented to person, place, and time.  Skin: Skin is warm and dry.  Psychiatric: He has a normal mood and affect. His behavior is normal.  Nursing note and vitals reviewed.   ED Course  Procedures (including critical care time) DIAGNOSTIC STUDIES: Oxygen Saturation is 98% on room air, normal by my interpretation.    COORDINATION OF CARE: 12:06 PM Discussed treatment plan with patient at beside, the patient agrees with the plan and has no further questions at this time.   1:53 PM Penile discharge, with risky sexual  behaviors.  Rocephin/zithromax given.  Doxycycline x 7 days. Cultures sent.  i offer HIV and Syphillis testing, pt declined.  Recommend safe sex practice and notify partner.    Labs Review Labs Reviewed  URINALYSIS, ROUTINE W REFLEX MICROSCOPIC - Abnormal; Notable for the following:    APPearance CLOUDY (*)    Hgb urine dipstick TRACE (*)    Leukocytes, UA MODERATE (*)    All other components within normal limits  URINE MICROSCOPIC-ADD ON - Abnormal; Notable for the following:    Bacteria, UA FEW (*)    All other components within normal  limits  GC/CHLAMYDIA PROBE AMP    Imaging Review No results found.   EKG Interpretation None      MDM   Final diagnoses:  Abnormal penile discharge   BP 116/85 mmHg  Pulse 74  Temp(Src) 97.8 F (36.6 C) (Oral)  Resp 15  Ht 5\' 8"  (1.727 m)  Wt 145 lb (65.772 kg)  BMI 22.05 kg/m2  SpO2 98%   I personally performed the services described in this documentation, which was scribed in my presence. The recorded information has been reviewed and is accurate.      Fayrene HelperBowie Utah Delauder, PA-C 05/12/14 1354  Candyce ChurnJohn David Wofford III, MD 05/14/14 [phone]

## 2014-05-12 NOTE — Discharge Instructions (Signed)

## 2014-05-15 LAB — GC/CHLAMYDIA PROBE AMP
CT Probe RNA: NEGATIVE
GC Probe RNA: POSITIVE — AB

## 2014-05-16 ENCOUNTER — Telehealth (HOSPITAL_BASED_OUTPATIENT_CLINIC_OR_DEPARTMENT_OTHER): Payer: Self-pay | Admitting: *Deleted

## 2014-05-21 ENCOUNTER — Encounter (HOSPITAL_COMMUNITY): Payer: Self-pay | Admitting: Emergency Medicine

## 2014-05-21 ENCOUNTER — Emergency Department (HOSPITAL_COMMUNITY)
Admission: EM | Admit: 2014-05-21 | Discharge: 2014-05-21 | Disposition: A | Discharge status: Home / Self Care | Payer: Self-pay | Attending: Emergency Medicine | Admitting: Emergency Medicine

## 2014-05-21 DIAGNOSIS — Y998 Other external cause status: Secondary | ICD-10-CM | POA: Insufficient documentation

## 2014-05-21 DIAGNOSIS — X58XXXA Exposure to other specified factors, initial encounter: Secondary | ICD-10-CM | POA: Insufficient documentation

## 2014-05-21 DIAGNOSIS — Z720 Tobacco use: Secondary | ICD-10-CM | POA: Insufficient documentation

## 2014-05-21 DIAGNOSIS — Y9289 Other specified places as the place of occurrence of the external cause: Secondary | ICD-10-CM | POA: Insufficient documentation

## 2014-05-21 DIAGNOSIS — S0502XA Injury of conjunctiva and corneal abrasion without foreign body, left eye, initial encounter: Secondary | ICD-10-CM | POA: Insufficient documentation

## 2014-05-21 DIAGNOSIS — Z792 Long term (current) use of antibiotics: Secondary | ICD-10-CM | POA: Insufficient documentation

## 2014-05-21 DIAGNOSIS — Z8619 Personal history of other infectious and parasitic diseases: Secondary | ICD-10-CM | POA: Insufficient documentation

## 2014-05-21 DIAGNOSIS — Y9389 Activity, other specified: Secondary | ICD-10-CM | POA: Insufficient documentation

## 2014-05-21 MED ORDER — TETRACAINE HCL 0.5 % OP SOLN
1.0000 [drp] | Freq: Once | OPHTHALMIC | Status: AC
  Administered 2014-05-21: 1 [drp] via OPHTHALMIC
  Filled 2014-05-21: qty 2

## 2014-05-21 MED ORDER — FLUORESCEIN SODIUM 1 MG OP STRP
1.0000 | ORAL_STRIP | Freq: Once | OPHTHALMIC | Status: AC
  Administered 2014-05-21: 1 via OPHTHALMIC
  Filled 2014-05-21: qty 1

## 2014-05-21 MED ORDER — POLYMYXIN B-TRIMETHOPRIM 10000-0.1 UNIT/ML-% OP SOLN: 1.0000 [drp] | OPHTHALMIC | Status: DC

## 2014-05-21 NOTE — ED Notes (Signed)
Started 2 days ago with left eye pain and irritation. No known injury. Left eye red and tearing.

## 2014-05-21 NOTE — Discharge Instructions (Signed)
Use eyedrops until symptoms resolve. Refer to attached documents for more information. Return to the ED with worsening or concerning symptoms.

## 2014-05-21 NOTE — ED Provider Notes (Signed)
CSN: [id number]     Arrival date & time 05/21/14  1331 History  This chart was scribed for non-physician practitioner, Emilia BeckKaitlyn Debanhi Blaker, PA-C, working with Raeford RazorStephen Kohut, MD, by Ronney LionSuzanne Le, ED Scribe. This patient was seen in room TR04C/TR04C and the patient's care was started at 2:35 PM.    No chief complaint on file.  The history is provided by the patient. No language interpreter was used.     HPI Comments: Jamie Lowe is a 29 y.o. male who presents to the Emergency Department complaining of left eye swelling, pain, and crusting that began 2 days ago. Patient states he had Cheetos on his hand and rubbed his eye. The pain is burning and severe without radiation. No associated symptoms. No aggravating/alleviating factors.    Past Medical History  Diagnosis Date  . STD (male)    Past Surgical History  Procedure Laterality Date  . Knee sugery (left)      Pt states "I think it was a ligament tear"   No family history on file. History  Substance Use Topics  . Smoking status: Current Every Day Smoker    Types: Cigars  . Smokeless tobacco: Not on file  . Alcohol Use: Yes    Review of Systems  Eyes: Positive for pain, discharge, redness and itching.  All other systems reviewed and are negative.     Allergies  Review of patient's allergies indicates no known allergies.  Home Medications   Prior to Admission medications   Medication Sig Start Date End Date Taking? Authorizing Provider  doxycycline (VIBRAMYCIN) 100 MG capsule Take 1 capsule (100 mg total) by mouth 2 (two) times daily. One po bid x 7 days 05/12/14   Fayrene HelperBowie Tran, PA-C  metroNIDAZOLE (FLAGYL) 500 MG tablet Take 1 tablet (500 mg total) by mouth 2 (two) times daily. 02/22/14   Heather Laisure, PA-C   BP 128/77 mmHg  Pulse 66  Temp(Src) 97.2 F (36.2 C) (Oral)  SpO2 100% Physical Exam  Constitutional: He is oriented to person, place, and time. He appears well-developed and well-nourished. No distress.  HENT:   Head: Normocephalic and atraumatic.  Eyes: EOM are normal. Pupils are equal, round, and reactive to light. Left conjunctiva is injected.  Left eye conjunctival injection. Corneal abrasion noted on fluorescein dye exam at the 3 o' clock and 9 o' clock position.  Neck: Neck supple. No tracheal deviation present.  Cardiovascular: Normal rate.   Pulmonary/Chest: Effort normal. No respiratory distress.  Musculoskeletal: Normal range of motion.  Neurological: He is alert and oriented to person, place, and time.  Skin: Skin is warm and dry.  Psychiatric: He has a normal mood and affect. His behavior is normal.  Nursing note and vitals reviewed.   ED Course  Procedures (including critical care time)  DIAGNOSTIC STUDIES: Oxygen Saturation is 100% on room air, normal by my interpretation.    COORDINATION OF CARE: 2:42 PM - Discussed treatment plan with pt at bedside which includes antibiotic eye drops, and pt agreed to plan.   MDM   Final diagnoses:  Corneal abrasion, left, initial encounter   Patient has a corneal abrasion of the left eye seen with fluorescin dye exam. No vision changes. Patient will have antibiotic eyedrops.   I personally performed the services described in this documentation, which was scribed in my presence. The recorded information has been reviewed and is accurate.     Emilia BeckKaitlyn Trenita Hulme, PA-C 05/24/14 [id number]  Raeford RazorStephen Kohut, MD 05/24/14 1520

## 2015-03-25 ENCOUNTER — Emergency Department (HOSPITAL_COMMUNITY): Payer: Self-pay

## 2015-03-25 ENCOUNTER — Encounter (HOSPITAL_COMMUNITY): Payer: Self-pay | Admitting: Emergency Medicine

## 2015-03-25 ENCOUNTER — Emergency Department (HOSPITAL_COMMUNITY)
Admission: EM | Admit: 2015-03-25 | Discharge: 2015-03-26 | Disposition: A | Discharge status: Home / Self Care | Payer: Self-pay | Attending: Emergency Medicine | Admitting: Emergency Medicine

## 2015-03-25 DIAGNOSIS — Z720 Tobacco use: Secondary | ICD-10-CM | POA: Insufficient documentation

## 2015-03-25 DIAGNOSIS — Z8619 Personal history of other infectious and parasitic diseases: Secondary | ICD-10-CM | POA: Insufficient documentation

## 2015-03-25 DIAGNOSIS — R0781 Pleurodynia: Secondary | ICD-10-CM

## 2015-03-25 DIAGNOSIS — L819 Disorder of pigmentation, unspecified: Secondary | ICD-10-CM | POA: Insufficient documentation

## 2015-03-25 DIAGNOSIS — Z792 Long term (current) use of antibiotics: Secondary | ICD-10-CM | POA: Insufficient documentation

## 2015-03-25 LAB — I-STAT CHEM 8, ED
BUN: 8 mg/dL (ref 6–20)
Calcium, Ion: 1.26 mmol/L — ABNORMAL HIGH (ref 1.12–1.23)
Chloride: 104 mmol/L (ref 101–111)
Creatinine, Ser: 1.2 mg/dL (ref 0.61–1.24)
Glucose, Bld: 92 mg/dL (ref 65–99)
HCT: 47 % (ref 39.0–52.0)
Hemoglobin: 16 g/dL (ref 13.0–17.0)
Potassium: 4.1 mmol/L (ref 3.5–5.1)
Sodium: 142 mmol/L (ref 135–145)
TCO2: 26 mmol/L (ref 0–100)

## 2015-03-25 LAB — I-STAT TROPONIN, ED: Troponin i, poc: 0 ng/mL (ref 0.00–0.08)

## 2015-03-25 MED ORDER — IOHEXOL 350 MG/ML SOLN
90.0000 mL | Freq: Once | INTRAVENOUS | Status: AC | PRN
  Administered 2015-03-25: 90 mL via INTRAVENOUS

## 2015-03-25 NOTE — Discharge Instructions (Signed)
Chest Pain Observation °It is often hard to give a specific diagnosis for the cause of chest pain. Among other possibilities your symptoms might be caused by inadequate oxygen delivery to your heart (angina). Angina that is not treated or evaluated can lead to a heart attack (myocardial infarction) or death. °Blood tests, electrocardiograms, and X-rays may have been done to help determine a possible cause of your chest pain. After evaluation and observation, your health care provider has determined that it is unlikely your pain was caused by an unstable condition that requires hospitalization. However, a full evaluation of your pain may need to be completed, with additional diagnostic testing as directed. It is very important to keep your follow-up appointments. Not keeping your follow-up appointments could result in permanent heart damage, disability, or death. If there is any problem keeping your follow-up appointments, you must call your health care provider. °HOME CARE INSTRUCTIONS  °Due to the slight chance that your pain could be angina, it is important to follow your health care provider's treatment plan and also maintain a healthy lifestyle: °· Maintain or work toward achieving a healthy weight. °· Stay physically active and exercise regularly. °· Decrease your salt intake. °· Eat a balanced, healthy diet. Talk to a dietitian to learn about heart-healthy foods. °· Increase your fiber intake by including whole grains, vegetables, fruits, and nuts in your diet. °· Avoid situations that cause stress, anger, or depression. °· Take medicines as advised by your health care provider. Report any side effects to your health care provider. Do not stop medicines or adjust the dosages on your own. °· Quit smoking. Do not use nicotine patches or gum until you check with your health care provider. °· Keep your blood pressure, blood sugar, and cholesterol levels within normal limits. °· Limit alcohol intake to no more than  1 drink per day for women who are not pregnant and 2 drinks per day for men. °· Do not abuse drugs. °SEEK IMMEDIATE MEDICAL CARE IF: °You have severe chest pain or pressure which may include symptoms such as: °· You feel pain or pressure in your arms, neck, jaw, or back. °· You have severe back or abdominal pain, feel sick to your stomach (nauseous), or throw up (vomit). °· You are sweating profusely. °· You are having a fast or irregular heartbeat. °· You feel short of breath while at rest. °· You notice increasing shortness of breath during rest, sleep, or with activity. °· You have chest pain that does not get better after rest or after taking your usual medicine. °· You wake from sleep with chest pain. °· You are unable to sleep because you cannot breathe. °· You develop a frequent cough or you are coughing up blood. °· You feel dizzy, faint, or experience extreme fatigue. °· You develop severe weakness, dizziness, fainting, or chills. °Any of these symptoms may represent a serious problem that is an emergency. Do not wait to see if the symptoms will go away. Call your local emergency services (911 in the [place] Do not drive yourself to the hospital. °MAKE SURE YOU: °· Understand these instructions. °· Will watch your condition. °· Will get help right away if you are not doing well or get worse. °  °This information is not intended to replace advice given to you by your health care provider. Make sure you discuss any questions you have with your health care provider. °  °Document Released: 06/05/2010 Document Revised: 05/08/2013 Document Reviewed: 11/02/2012 °Elsevier Interactive Patient   Education 2016 [employer]  Chest Wall Pain Chest wall pain is pain in or around the bones and muscles of your chest. Sometimes, an injury causes this pain. Sometimes, the cause may not be known. This pain may take several weeks or longer to get better. HOME CARE Pay attention to any changes in your symptoms. Take these  actions to help with your pain:  Rest as told by your doctor.  Avoid activities that cause pain. Try not to use your chest, belly (abdominal), or side muscles to lift heavy things.  If directed, apply ice to the painful area:  Put ice in a plastic bag.  Place a towel between your skin and the bag.  Leave the ice on for 20 minutes, 2-3 times per day.  Take over-the-counter and prescription medicines only as told by your doctor.  Do not use tobacco products, including cigarettes, chewing tobacco, and e-cigarettes. If you need help quitting, ask your doctor.  Keep all follow-up visits as told by your doctor. This is important. GET HELP IF:  You have a fever.  Your chest pain gets worse.  You have new symptoms. GET HELP RIGHT AWAY IF:  You feel sick to your stomach (nauseous) or you throw up (vomit).  You feel sweaty or light-headed.  You have a cough with phlegm (sputum) or you cough up blood.  You are short of breath.   This information is not intended to replace advice given to you by your health care provider. Make sure you discuss any questions you have with your health care provider.   Document Released: 10/20/2007 Document Revised: 01/22/2015 Document Reviewed: 07/29/2014 Elsevier Interactive Patient Education 2016 [employer]  Nonspecific Chest Pain  Chest pain can be caused by many different conditions. There is always a chance that your pain could be related to something serious, such as a heart attack or a blood clot in your lungs. Chest pain can also be caused by conditions that are not life-threatening. If you have chest pain, it is very important to follow up with your health care provider. CAUSES  Chest pain can be caused by:  Heartburn.  Pneumonia or bronchitis.  Anxiety or stress.  Inflammation around your heart (pericarditis) or lung (pleuritis or pleurisy).  A blood clot in your lung.  A collapsed lung (pneumothorax). It can develop suddenly  on its own (spontaneous pneumothorax) or from trauma to the chest.  Shingles infection (varicella-zoster virus).  Heart attack.  Damage to the bones, muscles, and cartilage that make up your chest wall. This can include:  Bruised bones due to injury.  Strained muscles or cartilage due to frequent or repeated coughing or overwork.  Fracture to one or more ribs.  Sore cartilage due to inflammation (costochondritis). RISK FACTORS  Risk factors for chest pain may include:  Activities that increase your risk for trauma or injury to your chest.  Respiratory infections or conditions that cause frequent coughing.  Medical conditions or overeating that can cause heartburn.  Heart disease or family history of heart disease.  Conditions or health behaviors that increase your risk of developing a blood clot.  Having had chicken pox (varicella zoster). SIGNS AND SYMPTOMS Chest pain can feel like:  Burning or tingling on the surface of your chest or deep in your chest.  Crushing, pressure, aching, or squeezing pain.  Dull or sharp pain that is worse when you move, cough, or take a deep breath.  Pain that is also felt in your back, neck,  shoulder, or arm, or pain that spreads to any of these areas. Your chest pain may come and go, or it may stay constant. DIAGNOSIS Lab tests or other studies may be needed to find the cause of your pain. Your health care provider may have you take a test called an ambulatory ECG (electrocardiogram). An ECG records your heartbeat patterns at the time the test is performed. You may also have other tests, such as:  Transthoracic echocardiogram (TTE). During echocardiography, sound waves are used to create a picture of all of the heart structures and to look at how blood flows through your heart.  Transesophageal echocardiogram (TEE).This is a more advanced imaging test that obtains images from inside your body. It allows your health care provider to see  your heart in finer detail.  Cardiac monitoring. This allows your health care provider to monitor your heart rate and rhythm in real time.  Holter monitor. This is a portable device that records your heartbeat and can help to diagnose abnormal heartbeats. It allows your health care provider to track your heart activity for several days, if needed.  Stress tests. These can be done through exercise or by taking medicine that makes your heart beat more quickly.  Blood tests.  Imaging tests. TREATMENT  Your treatment depends on what is causing your chest pain. Treatment may include:  Medicines. These may include:  Acid blockers for heartburn.  Anti-inflammatory medicine.  Pain medicine for inflammatory conditions.  Antibiotic medicine, if an infection is present.  Medicines to dissolve blood clots.  Medicines to treat coronary artery disease.  Supportive care for conditions that do not require medicines. This may include:  Resting.  Applying heat or cold packs to injured areas.  Limiting activities until pain decreases. HOME CARE INSTRUCTIONS  If you were prescribed an antibiotic medicine, finish it all even if you start to feel better.  Avoid any activities that bring on chest pain.  Do not use any tobacco products, including cigarettes, chewing tobacco, or electronic cigarettes. If you need help quitting, ask your health care provider.  Do not drink alcohol.  Take medicines only as directed by your health care provider.  Keep all follow-up visits as directed by your health care provider. This is important. This includes any further testing if your chest pain does not go away.  If heartburn is the cause for your chest pain, you may be told to keep your head raised (elevated) while sleeping. This reduces the chance that acid will go from your stomach into your esophagus.  Make lifestyle changes as directed by your health care provider. These may include:  Getting  regular exercise. Ask your health care provider to suggest some activities that are safe for you.  Eating a heart-healthy diet. A registered dietitian can help you to learn healthy eating options.  Maintaining a healthy weight.  Managing diabetes, if necessary.  Reducing stress. SEEK MEDICAL CARE IF:  Your chest pain does not go away after treatment.  You have a rash with blisters on your chest.  You have a fever. SEEK IMMEDIATE MEDICAL CARE IF:   Your chest pain is worse.  You have an increasing cough, or you cough up blood.  You have severe abdominal pain.  You have severe weakness.  You faint.  You have chills.  You have sudden, unexplained chest discomfort.  You have sudden, unexplained discomfort in your arms, back, neck, or jaw.  You have shortness of breath at any time.  You suddenly  start to sweat, or your skin gets clammy.  You feel nauseous or you vomit.  You suddenly feel light-headed or dizzy.  Your heart begins to beat quickly, or it feels like it is skipping beats. These symptoms may represent a serious problem that is an emergency. Do not wait to see if the symptoms will go away. Get medical help right away. Call your local emergency services (911 in the [place] Do not drive yourself to the hospital.   This information is not intended to replace advice given to you by your health care provider. Make sure you discuss any questions you have with your health care provider.   Document Released: 02/10/2005 Document Revised: 05/24/2014 Document Reviewed: 12/07/2013 Elsevier Interactive Patient Education 2016 [employer]  Pain Without a Known Cause WHAT IS PAIN WITHOUT A KNOWN CAUSE? Pain can occur in any part of the body and can range from mild to severe. Sometimes no cause can be found for why you are having pain. Some types of pain that can occur without a known cause include:   Headache.  Back pain.  Abdominal pain.  Neck pain. HOW IS PAIN  WITHOUT A KNOWN CAUSE DIAGNOSED?  Your health care provider will try to find the cause of your pain. This may include:  Physical exam.  Medical history.  Blood tests.  Urine tests.  X-rays. If no cause is found, your health care provider may diagnose you with pain without a known cause.  IS THERE TREATMENT FOR PAIN WITHOUT A CAUSE?  Treatment depends on the kind of pain you have. Your health care provider may prescribe medicines to help relieve your pain.  WHAT CAN I DO AT HOME FOR MY PAIN?   Take medicines only as directed by your health care provider.  Stop any activities that cause pain. During periods of severe pain, bed rest may help.  Try to reduce your stress with activities such as yoga or meditation. Talk to your health care provider for other stress-reducing activity recommendations.  Exercise regularly, if approved by your health care provider.  Eat a healthy diet that includes fruits and vegetables. This may improve pain. Talk to your health care provider if you have any questions about your diet. WHAT IF MY PAIN DOES NOT GET BETTER?  If you have a painful condition and no reason can be found for the pain or the pain gets worse, it is important to follow up with your health care provider. It may be necessary to repeat tests and look further for a possible cause.    This information is not intended to replace advice given to you by your health care provider. Make sure you discuss any questions you have with your health care provider.   Follow-up with primary care provider if symptoms continue. Recommend cessation of recreational drug use. This may be a factor in causing your chest pain. Return to the emergency room if you expands worsening of her symptoms, difficulty breathing, increased chest pain, numbness or tingling in your extremities, loss of consciousness, dizziness.

## 2015-03-25 NOTE — ED Provider Notes (Signed)
CSN: [id number]     Arrival date & time 03/25/15  1436 History   First MD Initiated Contact with Patient 03/25/15 1706     Chief Complaint  Patient presents with  . scapular pain   . black spots on tongue      (Consider location/radiation/quality/duration/timing/severity/associated sxs/prior Treatment) HPI   Jamie Lowe is a 29 y.o M with a pmhx of polysubstance abuse who presents to the Emergency Department c/o R anterior wall chest pain that radiates to his back. Patient endorses extensive history of frequent cocaine, Molly, cough syrup with codeine, alcohol usage. Patient also smokes multiple black and mild every day. [place] patient endorses increased use of aforementioned recreational drugs more so than usual. Now complaining of right anterior wall chest pain that has been present for a month. This pain radiates to his back. Pain is increased with deep inspiration. Patient also notes that he has been increasingly short of breath over the last week with increased dyspnea on exertion. Patient states he is unable to take out the trash without becoming short of breath, this is new for the patient. Denies actually swelling, history of DVT/PE, recent trauma, dizziness, syncope.  Past Medical History  Diagnosis Date  . STD (male)    Past Surgical History  Procedure Laterality Date  . Knee sugery (left)      Pt states "I think it was a ligament tear"   No family history on file. Social History  Substance Use Topics  . Smoking status: Current Every Day Smoker    Types: Cigars  . Smokeless tobacco: None  . Alcohol Use: Yes    Review of Systems  All other systems reviewed and are negative.     Allergies  Review of patient's allergies indicates no known allergies.  Home Medications   Prior to Admission medications   Medication Sig Start Date End Date Taking? Authorizing Provider  doxycycline (VIBRAMYCIN) 100 MG capsule Take 1 capsule  (100 mg total) by mouth 2 (two) times daily. One po bid x 7 days 05/12/14   Fayrene HelperBowie Tran, PA-C  metroNIDAZOLE (FLAGYL) 500 MG tablet Take 1 tablet (500 mg total) by mouth 2 (two) times daily. 02/22/14   Heather Laisure, PA-C  trimethoprim-polymyxin b (POLYTRIM) ophthalmic solution Place 1 drop into the left eye every 4 (four) hours. 05/21/14   Kaitlyn Szekalski, PA-C   BP 140/95 mmHg  Pulse 61  Temp(Src) 98.7 F (37.1 C) (Oral)  Resp 16  Ht 5\' 8"  (1.727 m)  Wt 145 lb (65.772 kg)  BMI 22.05 kg/m2  SpO2 100% Physical Exam  Constitutional: He is oriented to person, place, and time. He appears well-developed and well-nourished. No distress.  HENT:  Head: Normocephalic and atraumatic.  Mouth/Throat: Oropharynx is clear and moist. No oropharyngeal exudate.  Eyes: Conjunctivae and EOM are normal. Pupils are equal, round, and reactive to light. Right eye exhibits no discharge. Left eye exhibits no discharge. No scleral icterus.  Cardiovascular: Normal rate, regular rhythm, normal heart sounds and intact distal pulses.  Exam reveals no gallop and no friction rub.   No murmur heard. Pulmonary/Chest: Effort normal and breath sounds normal. No respiratory distress. He has no wheezes. He has no rales. He exhibits no tenderness.  Abdominal: Soft. He exhibits no distension. There is no tenderness. There is no guarding.  Musculoskeletal: Normal range of motion. He exhibits no edema.  Neurological: He is alert and oriented to person, place, and time. No cranial nerve deficit.  Strength 5/5 throughout. No sensory deficits.  No gait abnormality.  Skin: Skin is warm and dry. No rash noted. He is not diaphoretic. No erythema. No pallor.  Psychiatric: He has a normal mood and affect. His behavior is normal.  Nursing note and vitals reviewed.   ED Course  Procedures (including critical care time) Labs Review Labs Reviewed  I-STAT CHEM 8, ED - Abnormal; Notable for the following:    Calcium, Ion 1.26 (*)     All other components within normal limits  I-STAT TROPOININ, ED    Imaging Review Dg Chest 2 View  03/25/2015  CLINICAL DATA:  Right upper chest pain with right shoulder pain and scapular pain 1 month. No trauma. EXAM: CHEST  2 VIEW COMPARISON:  None. FINDINGS: The heart size and mediastinal contours are within normal limits. Both lungs are clear. The visualized skeletal structures are unremarkable. IMPRESSION: No active cardiopulmonary disease. Electronically Signed   By: Elberta Fortis M.D.   On: 03/25/2015 17:22   Dg Shoulder Right  03/25/2015  CLINICAL DATA:  Patient with right upper chest and right shoulder pain. No history of trauma. Initial encounter. EXAM: RIGHT SHOULDER - 2+ VIEW COMPARISON:  None. FINDINGS: There is no evidence of fracture or dislocation. There is no evidence of arthropathy or other focal bone abnormality. Soft tissues are unremarkable. IMPRESSION: No acute osseous abnormality. Electronically Signed   By: Annia Belt M.D.   On: 03/25/2015 17:22   I have personally reviewed and evaluated these images and lab results as part of my medical decision-making.   EKG Interpretation   Date/Time:  Tuesday March 25 2015 20:13:02 EST Ventricular Rate:  61 PR Interval:  158 QRS Duration: 84 QT Interval:  426 QTC Calculation: 428 R Axis:   76 Text Interpretation:  Normal sinus rhythm Left ventricular hypertrophy  Early repolarization Abnormal ECG No old tracing to compare Confirmed by  KNAPP  MD-J, JON ([id number]) on 03/25/2015 8:22:49 PM      MDM   Final diagnoses:  Pleuritic chest pain    29 year old male with extensive history of polysubstance abuse, recent cocaine, Molly, alcohol usage presents with right-sided anterior chest pain that radiates to his back. Pain is pleuritic in nature. Increases with deep inspiration. Patient also notes increased shortness of breath over the last week. Concern for pulmonary embolism.  Patient brought back to fast track area. After  hearing patient's story spoke with Dr. Dalene Seltzer who believes that patient needs higher level of care with cardiac monitoring to rule out pulmonary embolism given patient's drug use and pleuritic chest pain. Patient not hypoxic or tachycardic.Marland Kitchen Recommend that patient be moved to higher acuity emergency department bed. See previous MSE notes.   Due to volume overload in the ED, patient still waiting in fast track for bed. Patient anxious to leave. Discussed with patient importance of staying and significance of pulmonary embolism. Patient willing to stay.  Will collect i-STAT Chem-8, EKG and troponin in ED. Patient not able to be in cardiac monitoring while in fast track. Patient no apparent distress. Vital signs stable.  CT PE study ordered.  Initial troponin normal. I-STAT Chem-8 within normal limits. CXR negative.  CT PE study negative for acute PE. VSS. Pt stable for discharge. Recommend cessation of recreational drug use. Recommend follow up with PCP if pain continues.   Dub Mikes, PA-C 03/25/15 [address] [place], PA-C 03/25/15 2130  Alvira Monday, MD 03/26/15 1132

## 2015-03-25 NOTE — ED Provider Notes (Signed)
MSE was initiated and I personally evaluated the patient and placed orders (if any) at  5:50 PM on March 25, 2015.  Pt with extensive history of poly substance abuse including cocaine, Molly, cough syrup with codiene and alcohol who presents with anterior R chest pain that radiates to his back/R scapula area. This pain is increased with inspiration. Pt also notes increased SOB and DOE over the last week. Unable to take out the trash without becoming SOB. No neurological symptoms. Not hypoxic or tachycardic. However, Believe that pt will need a PE r/o due to pleuritic chest pain and high risk cocaine use. Spoke with Dr. Dalene SeltzerSchlossman who agrees. Inappropriate for fast track. Believe that pt will require a higher level of care.  Pt also reports having unprotected sex with multiple partners over the last 2 weeks. No urinary symptoms, fever.   Physical exam: heart and lung exam benign. No LE edema. No neurological deficits.  CBC, BMP, EKG ordered. VSS.   The patient appears stable so that the remainder of the MSE may be completed by another provider.  Lester KinsmanSamantha Tripp MerrillDowless, PA-C 03/25/15 1755  Alvira MondayErin Schlossman, MD 03/26/15 1121

## 2015-03-25 NOTE — ED Notes (Signed)
Patient states R side scapular pain with no radiation.  Patient denies injury.  Patient states noticed "black spots on my tongue and I want to make sure not tongue cancer".  Patient denies pain or any other symptoms.

## 2015-03-25 NOTE — ED Notes (Signed)
Pt to CT at this time.

## 2015-03-25 NOTE — ED Provider Notes (Signed)
Pt is anxious to leave to pick up his daughter. Concern that pt will be waiting in fast track room too long for higher lever ED room. Will order istat chem 8 and CT PE study.    Lester KinsmanSamantha Tripp CathcartDowless, PA-C 03/25/15 1915  Alvira MondayErin Schlossman, MD 03/26/15 [phone]

## 2015-10-25 ENCOUNTER — Encounter (HOSPITAL_COMMUNITY): Payer: Self-pay

## 2015-10-25 ENCOUNTER — Other Ambulatory Visit: Payer: Self-pay | Admitting: Orthopedic Surgery

## 2015-10-25 ENCOUNTER — Emergency Department (HOSPITAL_COMMUNITY): Payer: Self-pay

## 2015-10-25 ENCOUNTER — Inpatient Hospital Stay (HOSPITAL_COMMUNITY)
Admission: EM | Admit: 2015-10-25 | Discharge: 2015-10-27 | DRG: 493 | Disposition: A | Discharge status: Home / Self Care | Payer: Self-pay | Attending: Orthopedic Surgery | Admitting: Orthopedic Surgery

## 2015-10-25 DIAGNOSIS — F17290 Nicotine dependence, other tobacco product, uncomplicated: Secondary | ICD-10-CM | POA: Diagnosis present

## 2015-10-25 DIAGNOSIS — S82253A Displaced comminuted fracture of shaft of unspecified tibia, initial encounter for closed fracture: Secondary | ICD-10-CM

## 2015-10-25 DIAGNOSIS — D62 Acute posthemorrhagic anemia: Secondary | ICD-10-CM | POA: Diagnosis not present

## 2015-10-25 DIAGNOSIS — S82251A Displaced comminuted fracture of shaft of right tibia, initial encounter for closed fracture: Principal | ICD-10-CM | POA: Diagnosis present

## 2015-10-25 DIAGNOSIS — S82451A Displaced comminuted fracture of shaft of right fibula, initial encounter for closed fracture: Secondary | ICD-10-CM | POA: Diagnosis present

## 2015-10-25 DIAGNOSIS — Z419 Encounter for procedure for purposes other than remedying health state, unspecified: Secondary | ICD-10-CM

## 2015-10-25 DIAGNOSIS — S82209A Unspecified fracture of shaft of unspecified tibia, initial encounter for closed fracture: Secondary | ICD-10-CM | POA: Diagnosis present

## 2015-10-25 DIAGNOSIS — Y9361 Activity, american tackle football: Secondary | ICD-10-CM

## 2015-10-25 MED ORDER — POVIDONE-IODINE 10 % EX SWAB: 2.0000 "application " | Freq: Once | CUTANEOUS | Status: AC

## 2015-10-25 MED ORDER — PHENOL 1.4 % MT LIQD: 1.0000 | OROMUCOSAL | Status: AC | PRN

## 2015-10-25 MED ORDER — SODIUM CHLORIDE 0.9 % IV SOLN: INTRAVENOUS | Status: AC

## 2015-10-25 MED ORDER — ALUM & MAG HYDROXIDE-SIMETH 200-200-20 MG/5ML PO SUSP: 30.0000 mL | ORAL | Status: AC | PRN

## 2015-10-25 MED ORDER — FENTANYL CITRATE (PF) 100 MCG/2ML IJ SOLN
100.0000 ug | Freq: Once | INTRAMUSCULAR | Status: AC
  Administered 2015-10-25: 100 ug via INTRAVENOUS
  Filled 2015-10-25: qty 2

## 2015-10-25 MED ORDER — DIPHENHYDRAMINE HCL 12.5 MG/5ML PO ELIX: 12.5000 mg | ORAL_SOLUTION | ORAL | Status: AC | PRN

## 2015-10-25 MED ORDER — ACETAMINOPHEN 325 MG PO TABS: 1000.0000 mg | ORAL_TABLET | Freq: Once | ORAL | Status: AC

## 2015-10-25 MED ORDER — SENNOSIDES-DOCUSATE SODIUM 8.6-50 MG PO TABS: 1.0000 | ORAL_TABLET | Freq: Every evening | ORAL | Status: AC | PRN

## 2015-10-25 MED ORDER — ACETAMINOPHEN 650 MG RE SUPP: 650.0000 mg | Freq: Four times a day (QID) | RECTAL | Status: AC | PRN

## 2015-10-25 MED ORDER — ZOLPIDEM TARTRATE 5 MG PO TABS: 5.0000 mg | ORAL_TABLET | Freq: Every evening | ORAL | Status: AC | PRN

## 2015-10-25 MED ORDER — HYDROMORPHONE HCL 2 MG/ML IJ SOLN: 1.0000 mg | INTRAMUSCULAR | Status: AC | PRN

## 2015-10-25 MED ORDER — HYDROMORPHONE HCL 1 MG/ML IJ SOLN
1.0000 mg | Freq: Once | INTRAMUSCULAR | Status: AC
  Administered 2015-10-25: 1 mg via INTRAVENOUS
  Filled 2015-10-25: qty 1

## 2015-10-25 MED ORDER — MENTHOL 3 MG MT LOZG: 1.0000 | LOZENGE | OROMUCOSAL | Status: AC | PRN

## 2015-10-25 MED ORDER — METHOCARBAMOL 500 MG PO TABS: 500.0000 mg | ORAL_TABLET | Freq: Four times a day (QID) | ORAL | Status: AC | PRN

## 2015-10-25 MED ORDER — SODIUM CHLORIDE 0.9 % IV SOLN: 4.0000 mg | Freq: Four times a day (QID) | INTRAVENOUS | Status: AC | PRN

## 2015-10-25 MED ORDER — ONDANSETRON HCL 4 MG/2ML IJ SOLN
4.0000 mg | Freq: Once | INTRAMUSCULAR | Status: AC
  Administered 2015-10-25: 4 mg via INTRAVENOUS
  Filled 2015-10-25: qty 2

## 2015-10-25 MED ORDER — ONDANSETRON HCL 4 MG PO TABS: 4.0000 mg | ORAL_TABLET | Freq: Four times a day (QID) | ORAL | Status: AC | PRN

## 2015-10-25 MED ORDER — CHLORHEXIDINE GLUCONATE 4 % EX LIQD: 60.0000 mL | Freq: Once | CUTANEOUS | Status: AC

## 2015-10-25 MED ORDER — OXYCODONE HCL 5 MG PO TABS: 5.0000 mg | ORAL_TABLET | ORAL | Status: AC | PRN

## 2015-10-25 MED ORDER — ENOXAPARIN SODIUM 150 MG/ML ~~LOC~~ SOLN: 30.0000 mg | Freq: Two times a day (BID) | SUBCUTANEOUS | Status: AC

## 2015-10-25 MED ORDER — DOCUSATE SODIUM 50 MG PO CAPS: 100.0000 mg | ORAL_CAPSULE | Freq: Two times a day (BID) | ORAL | Status: AC

## 2015-10-25 MED ORDER — ACETAMINOPHEN 325 MG PO TABS: 650.0000 mg | ORAL_TABLET | Freq: Four times a day (QID) | ORAL | Status: AC | PRN

## 2015-10-25 MED ORDER — MORPHINE SULFATE (PF) 4 MG/ML IV SOLN
4.0000 mg | Freq: Once | INTRAVENOUS | Status: AC
  Administered 2015-10-25: 4 mg via INTRAVENOUS
  Filled 2015-10-25: qty 1

## 2015-10-25 MED ORDER — OXYCODONE HCL ER 10 MG PO T12A: 10.0000 mg | EXTENDED_RELEASE_TABLET | Freq: Two times a day (BID) | ORAL | Status: AC

## 2015-10-25 MED ORDER — CELECOXIB 100 MG PO CAPS: 200.0000 mg | ORAL_CAPSULE | Freq: Two times a day (BID) | ORAL | Status: AC

## 2015-10-25 MED ORDER — DEXTROSE 5 % IV SOLN: 2.0000 g | INTRAVENOUS | Status: AC

## 2015-10-25 MED ORDER — BISACODYL 5 MG PO TBEC: 5.0000 mg | DELAYED_RELEASE_TABLET | Freq: Every day | ORAL | Status: AC | PRN

## 2015-10-25 MED ORDER — CEFAZOLIN SODIUM 1 G IJ SOLR: 1.0000 g | Freq: Four times a day (QID) | INTRAMUSCULAR | Status: AC

## 2015-10-25 MED ORDER — METHOCARBAMOL 1000 MG/10ML IJ SOLN: 500.0000 mg | Freq: Four times a day (QID) | INTRAVENOUS | Status: AC | PRN

## 2015-10-25 NOTE — Progress Notes (Signed)
Orthopedic Tech Progress Note Patient Details:  Jamie FiscalSteven Lowe September 07, 1985 [id number]  Ortho Devices Type of Ortho Device: Crutches Ortho Device/Splint Location: RLE Ortho Device/Splint Interventions: Ordered, Adjustment   Jennye [name], [name] [name] 10/25/2015, 9:45 PM

## 2015-10-25 NOTE — Progress Notes (Signed)
Orthopedic Tech Progress Note Patient Details:  Carolyne FiscalSteven Yonke 09/20/1985 [id number]  Ortho Devices Type of Ortho Device: Ace wrap, Post (short leg) splint, Stirrup splint Ortho Device/Splint Location: RLE Ortho Device/Splint Interventions: Ordered, Application   Jennye [name], Mirah Nevins [name] 10/25/2015, 9:20 PM

## 2015-10-25 NOTE — ED Notes (Signed)
Deformity of the rt lower leg mid-shaft.  He was palying football and someone fell onto him and he heard something pop.  Good pedal pulse foot color good

## 2015-10-25 NOTE — H&P (Signed)
Carolyne FiscalSteven Wayment MRN:  [id number] DOB/SEX:  16-Nov-1985/male  CHIEF COMPLAINT:  Painful right Leg  HISTORY: Patient is a 30 y.o. male presented with a history of pain in the right leg. Onset of symptoms was abrupt starting a few hours ago with unchanged course since that time. Patient currently rates pain in the knee at 10 out of 10 with activity. There is pain at night.  PAST MEDICAL HISTORY: There are no active problems to display for this patient.  Past Medical History  Diagnosis Date  . STD (male)    Past Surgical History  Procedure Laterality Date  . Knee sugery (left)      Pt states "I think it was a ligament tear"     MEDICATIONS:   (Not in a hospital admission)  ALLERGIES:  No Known Allergies  REVIEW OF SYSTEMS:  A comprehensive review of systems was negative except for: Musculoskeletal: positive for bone pain and myalgias   FAMILY HISTORY:  No family history on file.  SOCIAL HISTORY:   Social History  Substance Use Topics  . Smoking status: Current Every Day Smoker    Types: Cigars  . Smokeless tobacco: Not on file  . Alcohol Use: Yes     EXAMINATION:  Vital signs in last 24 hours: @VSRANGES @  There were no vitals taken for this visit.  General Appearance:    Alert, cooperative, no distress, appears stated age  Head:    Normocephalic, without obvious abnormality, atraumatic  Eyes:    PERRL, conjunctiva/corneas clear, EOM's intact, fundi    benign, both eyes       Ears:    Normal TM's and external ear canals, both ears  Nose:   Nares normal, septum midline, mucosa normal, no drainage    or sinus tenderness  Throat:   Lips, mucosa, and tongue normal; teeth and gums normal  Neck:   Supple, symmetrical, trachea midline, no adenopathy;       thyroid:  No enlargement/tenderness/nodules; no carotid   bruit or JVD  Back:     Symmetric, no curvature, ROM normal, no CVA tenderness  Lungs:     Clear to auscultation bilaterally, respirations unlabored  Chest wall:     No tenderness or deformity  Heart:    Regular rate and rhythm, S1 and S2 normal, no murmur, rub   or gallop  Abdomen:     Soft, non-tender, bowel sounds active all four quadrants,    no masses, no organomegaly  Genitalia:    Normal male without lesion, discharge or tenderness  Rectal:    Normal tone, normal prostate, no masses or tenderness;   guaiac negative stool  Extremities:   Extremities normal, trama to right lower leg  Pulses:   2+ and symmetric all extremities  Skin:   Skin color, texture, turgor normal, no rashes or lesions  Lymph nodes:   Cervical, supraclavicular, and axillary nodes normal  Neurologic:   CNII-XII intact. Normal strength, sensation and reflexes      throughout    Musculoskeletal:  ROM decreased, Ligaments intact,  Imaging Review Plain radiographs demonstrate comminuted tib/fib fracture of the right leg. The bone quality appears to be excellent for age and reported activity level.  Assessment/Plan: Comminuted tib/fib fracture right leg  The patient history, physical examination and imaging studies are consistent with comminuted tib/fib fracture of the right leg. The patient has failed conservative treatment.  The clearance notes were reviewed.  After discussion with the patient it was felt that IM nail was  indicated. The patient acknowledged the explanation, agreed to proceed with the plan.  Guy Sandifer 10/25/2015, 10:15 PM

## 2015-10-25 NOTE — ED Notes (Addendum)
Pt here with c/o right leg pain. He states he was playing football about 45 min PTA and was tackled. Visible defomity to right lower leg. Pt able to wiggle his toes.

## 2015-10-25 NOTE — ED Provider Notes (Signed)
CSN: [id number]     Arrival date & time 10/25/15  1927 History   First MD Initiated Contact with Patient 10/25/15 2000     Chief Complaint  Patient presents with  . Leg Injury    (Consider location/radiation/quality/duration/timing/severity/associated sxs/prior Treatment) The history is provided by the patient and medical records. No language interpreter was used.    Jamie Lowe is an otherwise healthy 30 y.o. male who presents to the Emergency Department complaining of acute onset of constant right lower leg pain that occurred while playing football just prior to arrival. Another player was tackling him and hit the back of his right lower leg. Patient felt an immediate crack, then deformity and swelling ensued. Denies numbness, tingling. No medications taken PTA for symptoms.  No prior injuries to this extremity.   Past Medical History  Diagnosis Date  . STD (male)    Past Surgical History  Procedure Laterality Date  . Knee sugery (left)      Pt states "I think it was a ligament tear"   No family history on file. Social History  Substance Use Topics  . Smoking status: Current Every Day Smoker    Types: Cigars  . Smokeless tobacco: None  . Alcohol Use: Yes    Review of Systems  Constitutional: Negative for fever.  HENT: Negative for congestion.   Eyes: Negative for visual disturbance.  Respiratory: Negative for cough and shortness of breath.   Cardiovascular: Negative.   Gastrointestinal: Positive for nausea. Negative for vomiting and abdominal pain.  Musculoskeletal: Positive for myalgias and arthralgias.  Skin: Negative for pallor.  Allergic/Immunologic: Negative for immunocompromised state.  Neurological: Negative for numbness.      Allergies  Review of patient's allergies indicates no known allergies.  Home Medications   Prior to Admission medications   Not on File   BP 136/95 mmHg  Pulse 67  Temp(Src) 97.5 F (36.4 C) (Oral)  Resp 18  SpO2  100% Physical Exam  Constitutional: He is oriented to person, place, and time. He appears well-developed and well-nourished.  Alert and in no acute distress  HENT:  Head: Normocephalic and atraumatic.  Cardiovascular: Normal heart sounds and intact distal pulses.  Exam reveals no gallop and no friction rub.   No murmur heard. Mildly tachycardic but regular.   Pulmonary/Chest: Effort normal and breath sounds normal. No respiratory distress.  Abdominal: Soft. He exhibits no distension. There is no tenderness.  Musculoskeletal:  + deformity with swelling, bruising, and TTP of right distal anterior lower extremity. No open skin. Decreased ROM 2/2 pain. Compartments soft. Able to wiggle toes without difficulty. 2+ pedal pulse present.   Neurological: He is alert and oriented to person, place, and time.  Bilateral lower extremities neurovascularly intact.   Skin: Skin is warm and dry.  Nursing note and vitals reviewed.   ED Course  Procedures (including critical care time) Labs Review Labs Reviewed - No data to display  Imaging Review Dg Tibia/fibula Right  10/25/2015  CLINICAL DATA:  R tib/fib deformity playing football X today EXAM: RIGHT TIBIA AND FIBULA - 2 VIEW COMPARISON:  None. FINDINGS: Comminuted fracture junction of proximal and middle thirds fibular shaft with medial displacement of distal fracture fragment. Comminuted fracture distal shaft of the tibia with lateral displacement of distal fracture fragment. IMPRESSION: Comminuted fractures of the fibula and tibia Electronically Signed   By: Esperanza Heir M.D.   On: 10/25/2015 20:33   I have personally reviewed and evaluated these images and lab  results as part of my medical decision-making.   EKG Interpretation None      MDM   Final diagnoses:  Comminuted fracture of shaft of tibia   Jamie Lowe presents to ED for injury to right lower leg. + deformity on initial exam. RLE is neurovascularly intact and compartments  are soft. Able to wiggle toes without difficulty. Will obtain imaging and provide pain control.   Imaging: Comminuted tib-fib fracture Consults: Orthopedics, Dr. Sherlean FootLucey, who will come see patient.   Ortho to admit patient for surgery tomorrow.   Patient seen by and discussed with Dr. Manus Gunningancour who agrees with treatment plan.   [name], PA-C 10/26/15 [id number]  Glynn OctaveStephen Rancour, MD 10/26/15 [phone]

## 2015-10-25 NOTE — ED Notes (Signed)
The pt has been given morphine for pain.  Pain better unless moving it.

## 2015-10-25 NOTE — ED Notes (Signed)
Dr Sherlean Footlucey is here with pt

## 2015-10-25 NOTE — ED Notes (Signed)
To x-ray

## 2015-10-25 NOTE — ED Notes (Signed)
Ice pack to rt lower leg

## 2015-10-26 ENCOUNTER — Observation Stay (HOSPITAL_COMMUNITY): Payer: Self-pay

## 2015-10-26 ENCOUNTER — Observation Stay (HOSPITAL_COMMUNITY): Payer: Self-pay | Admitting: Certified Registered"

## 2015-10-26 ENCOUNTER — Encounter (HOSPITAL_COMMUNITY): Payer: Self-pay | Admitting: Certified Registered"

## 2015-10-26 ENCOUNTER — Encounter (HOSPITAL_COMMUNITY)
Admission: EM | Disposition: A | Discharge status: Home / Self Care | Payer: Self-pay | Source: Home / Self Care | Attending: Orthopedic Surgery

## 2015-10-26 ENCOUNTER — Other Ambulatory Visit: Payer: Self-pay | Admitting: Orthopedic Surgery

## 2015-10-26 DIAGNOSIS — S82209A Unspecified fracture of shaft of unspecified tibia, initial encounter for closed fracture: Secondary | ICD-10-CM | POA: Diagnosis present

## 2015-10-26 HISTORY — PX: TIBIA IM NAIL INSERTION: SHX2516

## 2015-10-26 LAB — MRSA PCR SCREENING: MRSA BY PCR: NEGATIVE

## 2015-10-26 SURGERY — INSERTION, INTRAMEDULLARY ROD, TIBIA
Anesthesia: General | Site: Leg Lower | Laterality: Right

## 2015-10-26 MED ORDER — ROCURONIUM BROMIDE 50 MG/5ML IV SOLN
INTRAVENOUS | Status: AC
  Filled 2015-10-26: qty 1

## 2015-10-26 MED ORDER — PROPOFOL 10 MG/ML IV BOLUS
INTRAVENOUS | Status: DC | PRN
  Administered 2015-10-26: 200 mg via INTRAVENOUS

## 2015-10-26 MED ORDER — HYDROMORPHONE HCL 1 MG/ML IJ SOLN
1.0000 mg | INTRAMUSCULAR | Status: DC | PRN
  Administered 2015-10-26 (×5): 1 mg via INTRAVENOUS
  Administered 2015-10-27 (×3): 2 mg via INTRAVENOUS
  Administered 2015-10-27: 1 mg via INTRAVENOUS
  Filled 2015-10-26: qty 1
  Filled 2015-10-26: qty 2
  Filled 2015-10-26 (×3): qty 1
  Filled 2015-10-26: qty 2
  Filled 2015-10-26: qty 1
  Filled 2015-10-26: qty 2
  Filled 2015-10-26: qty 1

## 2015-10-26 MED ORDER — 0.9 % SODIUM CHLORIDE (POUR BTL) OPTIME
TOPICAL | Status: DC | PRN
  Administered 2015-10-26: 1000 mL

## 2015-10-26 MED ORDER — MIDAZOLAM HCL 2 MG/2ML IJ SOLN
INTRAMUSCULAR | Status: AC
  Filled 2015-10-26: qty 2

## 2015-10-26 MED ORDER — PROPOFOL 10 MG/ML IV BOLUS
INTRAVENOUS | Status: AC
  Filled 2015-10-26: qty 20

## 2015-10-26 MED ORDER — LIDOCAINE 2% (20 MG/ML) 5 ML SYRINGE
INTRAMUSCULAR | Status: AC
  Filled 2015-10-26: qty 5

## 2015-10-26 MED ORDER — SUGAMMADEX SODIUM 200 MG/2ML IV SOLN
INTRAVENOUS | Status: AC
  Filled 2015-10-26: qty 2

## 2015-10-26 MED ORDER — LIDOCAINE HCL (CARDIAC) 20 MG/ML IV SOLN
INTRAVENOUS | Status: DC | PRN
  Administered 2015-10-26: 60 mg via INTRAVENOUS

## 2015-10-26 MED ORDER — CHLORHEXIDINE GLUCONATE 4 % EX LIQD
60.0000 mL | Freq: Once | CUTANEOUS | Status: AC
  Administered 2015-10-26: 4 via TOPICAL
  Filled 2015-10-26 (×2): qty 60

## 2015-10-26 MED ORDER — SUGAMMADEX SODIUM 200 MG/2ML IV SOLN
INTRAVENOUS | Status: DC | PRN
  Administered 2015-10-26: 200 mg via INTRAVENOUS

## 2015-10-26 MED ORDER — HYDROMORPHONE HCL 1 MG/ML IJ SOLN
0.2500 mg | INTRAMUSCULAR | Status: DC | PRN
  Administered 2015-10-26 (×2): 0.5 mg via INTRAVENOUS

## 2015-10-26 MED ORDER — PHENYLEPHRINE 40 MCG/ML (10ML) SYRINGE FOR IV PUSH (FOR BLOOD PRESSURE SUPPORT)
PREFILLED_SYRINGE | INTRAVENOUS | Status: AC
  Filled 2015-10-26: qty 10

## 2015-10-26 MED ORDER — HYDROMORPHONE HCL 1 MG/ML IJ SOLN
1.0000 mg | Freq: Once | INTRAMUSCULAR | Status: AC
  Administered 2015-10-26: 1 mg via INTRAVENOUS
  Filled 2015-10-26: qty 1

## 2015-10-26 MED ORDER — HYDROCODONE-ACETAMINOPHEN 7.5-325 MG PO TABS
1.0000 | ORAL_TABLET | Freq: Four times a day (QID) | ORAL | Status: DC
  Administered 2015-10-26 – 2015-10-27 (×5): 1 via ORAL
  Filled 2015-10-26 (×5): qty 1

## 2015-10-26 MED ORDER — ASPIRIN EC 325 MG PO TBEC
325.0000 mg | DELAYED_RELEASE_TABLET | Freq: Every day | ORAL | Status: DC
  Administered 2015-10-27: 325 mg via ORAL
  Filled 2015-10-26 (×2): qty 1

## 2015-10-26 MED ORDER — METOCLOPRAMIDE HCL 5 MG PO TABS
5.0000 mg | ORAL_TABLET | Freq: Three times a day (TID) | ORAL | Status: DC | PRN
  Filled 2015-10-26: qty 2

## 2015-10-26 MED ORDER — HYDROMORPHONE HCL 1 MG/ML IJ SOLN
INTRAMUSCULAR | Status: AC
  Filled 2015-10-26: qty 1

## 2015-10-26 MED ORDER — METOCLOPRAMIDE HCL 5 MG/ML IJ SOLN
5.0000 mg | Freq: Three times a day (TID) | INTRAMUSCULAR | Status: DC | PRN
  Filled 2015-10-26: qty 2

## 2015-10-26 MED ORDER — EPHEDRINE 5 MG/ML INJ
INTRAVENOUS | Status: AC
  Filled 2015-10-26: qty 10

## 2015-10-26 MED ORDER — POVIDONE-IODINE 10 % EX SWAB
2.0000 "application " | Freq: Once | CUTANEOUS | Status: AC
  Administered 2015-10-26: 2 via TOPICAL

## 2015-10-26 MED ORDER — OXYCODONE HCL 5 MG PO TABS: 5.0000 mg | ORAL_TABLET | ORAL | Status: DC | PRN

## 2015-10-26 MED ORDER — FLEET ENEMA 7-19 GM/118ML RE ENEM
1.0000 | ENEMA | Freq: Once | RECTAL | Status: DC | PRN
  Filled 2015-10-26: qty 1

## 2015-10-26 MED ORDER — ONDANSETRON HCL 4 MG/2ML IJ SOLN
INTRAMUSCULAR | Status: AC
  Filled 2015-10-26: qty 2

## 2015-10-26 MED ORDER — FENTANYL CITRATE (PF) 250 MCG/5ML IJ SOLN
INTRAMUSCULAR | Status: DC | PRN
  Administered 2015-10-26 (×2): 50 ug via INTRAVENOUS
  Administered 2015-10-26: 100 ug via INTRAVENOUS
  Administered 2015-10-26 (×2): 50 ug via INTRAVENOUS

## 2015-10-26 MED ORDER — MIDAZOLAM HCL 5 MG/5ML IJ SOLN
INTRAMUSCULAR | Status: DC | PRN
  Administered 2015-10-26: 2 mg via INTRAVENOUS

## 2015-10-26 MED ORDER — SODIUM CHLORIDE 0.9 % IV SOLN
INTRAVENOUS | Status: DC
  Administered 2015-10-26: 75 mL/h via INTRAVENOUS

## 2015-10-26 MED ORDER — ONDANSETRON HCL 4 MG/2ML IJ SOLN
INTRAMUSCULAR | Status: DC | PRN
  Administered 2015-10-26: 4 mg via INTRAVENOUS

## 2015-10-26 MED ORDER — FENTANYL CITRATE (PF) 250 MCG/5ML IJ SOLN
INTRAMUSCULAR | Status: AC
  Filled 2015-10-26: qty 5

## 2015-10-26 MED ORDER — PROMETHAZINE HCL 25 MG/ML IJ SOLN: 6.2500 mg | INTRAMUSCULAR | Status: DC | PRN

## 2015-10-26 MED ORDER — ROCURONIUM BROMIDE 100 MG/10ML IV SOLN
INTRAVENOUS | Status: DC | PRN
  Administered 2015-10-26: 50 mg via INTRAVENOUS

## 2015-10-26 MED ORDER — CEFAZOLIN SODIUM-DEXTROSE 2-4 GM/100ML-% IV SOLN
2.0000 g | INTRAVENOUS | Status: AC
Start: 2015-10-26 — End: 2015-10-26
  Administered 2015-10-26: 2 g via INTRAVENOUS
  Filled 2015-10-26: qty 100

## 2015-10-26 MED ORDER — SUCCINYLCHOLINE CHLORIDE 200 MG/10ML IV SOSY
PREFILLED_SYRINGE | INTRAVENOUS | Status: AC
  Filled 2015-10-26: qty 10

## 2015-10-26 MED ORDER — LACTATED RINGERS IV SOLN
INTRAVENOUS | Status: DC | PRN
  Administered 2015-10-26 (×2): via INTRAVENOUS

## 2015-10-26 SURGICAL SUPPLY — 54 items
9MMTI CANN TIBIAL NAIL -EX (Nail) ×3 IMPLANT
BANDAGE ACE 6X5 VEL STRL LF (GAUZE/BANDAGES/DRESSINGS) ×3 IMPLANT
BANDAGE ELASTIC 4 VELCRO ST LF (GAUZE/BANDAGES/DRESSINGS) ×3 IMPLANT
BANDAGE ELASTIC 6 VELCRO ST LF (GAUZE/BANDAGES/DRESSINGS) ×3 IMPLANT
BANDAGE ESMARK 6X9 LF (GAUZE/BANDAGES/DRESSINGS) IMPLANT
BIT DRILL FLUTED 3.2X145 SHORT (BIT) ×2 IMPLANT
BIT DRILL FLUTED 3.2X330 LONG (BIT) ×3 IMPLANT
BNDG COHESIVE 4X5 TAN STRL (GAUZE/BANDAGES/DRESSINGS) ×3 IMPLANT
BNDG ESMARK 4X9 LF (GAUZE/BANDAGES/DRESSINGS) ×3 IMPLANT
BNDG ESMARK 6X9 LF (GAUZE/BANDAGES/DRESSINGS)
BNDG GAUZE ELAST 4 BULKY (GAUZE/BANDAGES/DRESSINGS) ×3 IMPLANT
COVER SURGICAL LIGHT HANDLE (MISCELLANEOUS) ×6 IMPLANT
DRAPE C-ARM 42X72 X-RAY (DRAPES) ×3 IMPLANT
DRAPE IMP U-DRAPE 54X76 (DRAPES) ×3 IMPLANT
DRAPE INCISE IOBAN 66X45 STRL (DRAPES) ×3 IMPLANT
DRAPE ORTHO SPLIT 77X108 STRL (DRAPES) ×4
DRAPE SURG ORHT 6 SPLT 77X108 (DRAPES) ×2 IMPLANT
DRAPE U-SHAPE 47X51 STRL (DRAPES) ×3 IMPLANT
ELECT REM PT RETURN 9FT ADLT (ELECTROSURGICAL) ×3
ELECTRODE REM PT RTRN 9FT ADLT (ELECTROSURGICAL) ×1 IMPLANT
EVACUATOR 1/8 PVC DRAIN (DRAIN) IMPLANT
GAUZE SPONGE 4X4 12PLY STRL (GAUZE/BANDAGES/DRESSINGS) ×3 IMPLANT
GAUZE XEROFORM 1X8 LF (GAUZE/BANDAGES/DRESSINGS) ×3 IMPLANT
GAUZE XEROFORM 5X9 LF (GAUZE/BANDAGES/DRESSINGS) ×3 IMPLANT
GLOVE BIOGEL PI IND STRL 7.5 (GLOVE) IMPLANT
GLOVE BIOGEL PI IND STRL 8.5 (GLOVE) ×5 IMPLANT
GLOVE BIOGEL PI INDICATOR 7.5 (GLOVE)
GLOVE BIOGEL PI INDICATOR 8.5 (GLOVE) ×10
GLOVE SURG ORTHO 7.0 STRL STRW (GLOVE) IMPLANT
GLOVE SURG ORTHO 8.0 STRL STRW (GLOVE) ×18 IMPLANT
GOWN STRL REUS W/ TWL LRG LVL3 (GOWN DISPOSABLE) ×3 IMPLANT
GOWN STRL REUS W/TWL 2XL LVL3 (GOWN DISPOSABLE) ×3 IMPLANT
GOWN STRL REUS W/TWL LRG LVL3 (GOWN DISPOSABLE) ×6
GUIDEWIRE 3.2X400 (WIRE) ×3 IMPLANT
KIT BASIN OR (CUSTOM PROCEDURE TRAY) ×3 IMPLANT
KIT ROOM TURNOVER OR (KITS) ×3 IMPLANT
PACK ORTHO EXTREMITY (CUSTOM PROCEDURE TRAY) ×3 IMPLANT
PACK UNIVERSAL I (CUSTOM PROCEDURE TRAY) ×3 IMPLANT
PAD ARMBOARD 7.5X6 YLW CONV (MISCELLANEOUS) ×6 IMPLANT
REAMER ROD DEEP FLUTE 2.5X950 (INSTRUMENTS) ×3 IMPLANT
SCREW LOCKING 4.0 36MM (Screw) ×3 IMPLANT
SCREW LOCKING 4.0 40MM (Screw) ×3 IMPLANT
STAPLER VISISTAT 35W (STAPLE) ×3 IMPLANT
SUT BONE WAX W31G (SUTURE) ×3 IMPLANT
SUT ETHILON 2 0 FS 18 (SUTURE) ×6 IMPLANT
SUT VIC AB 0 CT1 27 (SUTURE)
SUT VIC AB 0 CT1 27XBRD ANBCTR (SUTURE) IMPLANT
SUT VIC AB 2-0 CT1 27 (SUTURE)
SUT VIC AB 2-0 CT1 TAPERPNT 27 (SUTURE) IMPLANT
TOWEL OR 17X24 6PK STRL BLUE (TOWEL DISPOSABLE) ×3 IMPLANT
TOWEL OR 17X26 10 PK STRL BLUE (TOWEL DISPOSABLE) ×3 IMPLANT
TUBE CONNECTING 12'X1/4 (SUCTIONS) ×1
TUBE CONNECTING 12X1/4 (SUCTIONS) ×2 IMPLANT
YANKAUER SUCT BULB TIP NO VENT (SUCTIONS) ×3 IMPLANT

## 2015-10-26 NOTE — Anesthesia Preprocedure Evaluation (Signed)
Anesthesia Evaluation  Patient identified by MRN, date of birth, ID band Patient awake    Reviewed: Allergy & Precautions, NPO status , Patient's Chart, lab work & pertinent test results  Airway Mallampati: II  TM Distance: >3 FB Neck ROM: Full    Dental   Pulmonary Current Smoker,  breath sounds clear to auscultation        Cardiovascular negative cardio ROS  Rhythm:Regular Rate:Normal     Neuro/Psych    GI/Hepatic negative GI ROS, Neg liver ROS,   Endo/Other  negative endocrine ROS  Renal/GU negative Renal ROS     Musculoskeletal   Abdominal   Peds  Hematology   Anesthesia Other Findings   Reproductive/Obstetrics                            Anesthesia Physical Anesthesia Plan  ASA: I  Anesthesia Plan: General   Post-op Pain Management:    Induction: Intravenous  Airway Management Planned: Oral ETT  Additional Equipment:   Intra-op Plan:   Post-operative Plan: Extubation in OR  Informed Consent: I have reviewed the patients History and Physical, chart, labs and discussed the procedure including the risks, benefits and alternatives for the proposed anesthesia with the patient or authorized representative who has indicated his/her understanding and acceptance.   Dental advisory given  Plan Discussed with: CRNA and Anesthesiologist  Anesthesia Plan Comments:         Anesthesia Quick Evaluation  

## 2015-10-26 NOTE — Transfer of Care (Signed)
Immediate Anesthesia Transfer of Care Note  Patient: Jamie Lowe  Procedure(s) Performed: Procedure(s): INTRAMEDULLARY (IM) NAIL TIBIAL (Right)  Patient Location: PACU  Anesthesia Type:General  Level of Consciousness: awake, alert , oriented and patient cooperative  Airway & Oxygen Therapy: Patient Spontanous Breathing and Patient connected to nasal cannula oxygen  Post-op Assessment: Report given to RN, Post -op Vital signs reviewed and stable and Patient moving all extremities  Post vital signs: Reviewed and stable  Last Vitals:  Filed Vitals:   10/26/15 0100 10/26/15 0607  BP: 139/103 143/86  Pulse: 56 68  Temp:  36.8 C  Resp: 16 16    Last Pain:  Filed Vitals:   10/26/15 0608  PainSc: Asleep         Complications: No apparent anesthesia complications

## 2015-10-26 NOTE — Progress Notes (Signed)
Called report:   

## 2015-10-26 NOTE — Anesthesia Postprocedure Evaluation (Signed)
Anesthesia Post Note  Patient: Jamie Lowe  Procedure(s) Performed: Procedure(s) (LRB): INTRAMEDULLARY (IM) NAIL TIBIAL (Right)  Patient location during evaluation: PACU Anesthesia Type: General Level of consciousness: awake Pain management: pain level controlled Respiratory status: spontaneous breathing Cardiovascular status: stable Anesthetic complications: no    Last Vitals:  Filed Vitals:   10/26/15 0950 10/26/15 1005  BP: 165/106 155/98  Pulse: 57 58  Temp:    Resp: 13 16    Last Pain:  Filed Vitals:   10/26/15 1010  PainSc: 3         RLE Motor Response: Purposeful movement (10/26/15 1005) RLE Sensation: Full sensation (10/26/15 1005)      EDWARDS,Kinsler Soeder

## 2015-10-26 NOTE — Progress Notes (Signed)
[facility] [facility] [place]. Nursing will continue to monitor.

## 2015-10-26 NOTE — Anesthesia Procedure Notes (Signed)
Procedure Name: Intubation Date/Time: 10/26/2015 8:01 AM Performed by: Lucinda DellECARLO, Shabana Armentrout M Pre-anesthesia Checklist: Patient identified, Emergency Drugs available, Suction available and Patient being monitored Patient Re-evaluated:Patient Re-evaluated prior to inductionOxygen Delivery Method: Circle system utilized Preoxygenation: Pre-oxygenation with 100% oxygen Intubation Type: IV induction Ventilation: Mask ventilation without difficulty Laryngoscope Size: Miller and 3 Grade View: Grade III Tube type: Oral Tube size: 7.5 mm Number of attempts: 2 Airway Equipment and Method: Stylet Placement Confirmation: ETT inserted through vocal cords under direct vision,  positive ETCO2 and breath sounds checked- equal and bilateral Secured at: 22 cm Tube secured with: Tape Dental Injury: Teeth and Oropharynx as per pre-operative assessment  Comments: Easy mask airway. DL with MAC 4, Grade III view. Attempted to pass ETT, but stopped when resistance felt. Resumed mask ventilation. DL with Hyacinth MeekerMiller 3 by MD. View improved with miller, atraumatic oral intubation.

## 2015-10-26 NOTE — Progress Notes (Signed)
Answering service for [facility] [place] called times 2 for admitting orders. Nursing will continue to monitor.

## 2015-10-27 ENCOUNTER — Encounter (HOSPITAL_COMMUNITY): Payer: Self-pay | Admitting: Orthopedic Surgery

## 2015-10-27 LAB — CBC
HCT: 36.2 % — ABNORMAL LOW (ref 39.0–52.0)
HEMOGLOBIN: 12 g/dL — AB (ref 13.0–17.0)
MCH: 31.3 pg (ref 26.0–34.0)
MCHC: 33.1 g/dL (ref 30.0–36.0)
MCV: 94.5 fL (ref 78.0–100.0)
PLATELETS: 202 10*3/uL (ref 150–400)
RBC: 3.83 MIL/uL — AB (ref 4.22–5.81)
RDW: 13.6 % (ref 11.5–15.5)
WBC: 9.8 10*3/uL (ref 4.0–10.5)

## 2015-10-27 LAB — BASIC METABOLIC PANEL
ANION GAP: 7 (ref 5–15)
CHLORIDE: 101 mmol/L (ref 101–111)
CO2: 28 mmol/L (ref 22–32)
Calcium: 9 mg/dL (ref 8.9–10.3)
Creatinine, Ser: 0.94 mg/dL (ref 0.61–1.24)
GFR calc Af Amer: >60 mL/min (ref >60)
Glucose, Bld: 116 mg/dL — ABNORMAL HIGH (ref 65–99)
POTASSIUM: 3.6 mmol/L (ref 3.5–5.1)
SODIUM: 136 mmol/L (ref 135–145)

## 2015-10-27 MED ORDER — WHITE PETROLATUM GEL
Status: AC
  Administered 2015-10-27: 12:00:00
  Filled 2015-10-27: qty 1

## 2015-10-27 MED ORDER — HYDROCODONE-ACETAMINOPHEN 7.5-325 MG PO TABS: 1.0000 | ORAL_TABLET | Freq: Four times a day (QID) | ORAL | Status: DC

## 2015-10-27 NOTE — Evaluation (Signed)
Occupational Therapy Evaluation Patient Details Name: Jamie Lowe MRN: [id number] DOB: 1985/12/06 Today's Date: 10/27/2015    History of Present Illness 30 y/o suffered tibia fracture while playing football. Pt s/p R IM nail (10/26/15)   Clinical Impression   Pt with decline in function with ADLs and ADL mobility with pain limiting pt's function. Pt using crutches with Good balance, sup - min guard A with transfers, min A with LB ADLs. Pt would benefit from acute OT services to increase level of function and safety to return home    Follow Up Recommendations  No OT follow up    Equipment Recommendations  Tub/shower bench;Other (comment) (A/E kit?)    Recommendations for Other Services       Precautions / Restrictions Precautions Precautions: None Restrictions Weight Bearing Restrictions: Yes RLE Weight Bearing: Touchdown weight bearing      Mobility Bed Mobility               General bed mobility comments: pt up in recliner upon entering room  Transfers Overall transfer level: Needs assistance Equipment used: Crutches Transfers: Sit to/from Stand Sit to Stand: Supervision         General transfer comment: Friend helping him with handing him crutches, but no physical assistance    Balance Overall balance assessment: Needs assistance   Sitting balance-Leahy Scale: Good       Standing balance-Leahy Scale: Fair Standing balance comment: requires UE support with dynamic activities                            ADL Overall ADL's : Needs assistance/impaired     Grooming: Wash/dry hands;Wash/dry face;Sitting;Set up;Oral care;Brushing hair;Applying deodorant   Upper Body Bathing: Supervision/ safety;Sitting   Lower Body Bathing: Minimal assistance;With caregiver independent assisting   Upper Body Dressing : Set up;Sitting   Lower Body Dressing: With caregiver independent assisting;Minimal assistance   Toilet Transfer: Min  guard;Supervision/safety;Grab bars (crutches)   Toileting- Clothing Manipulation and Hygiene: Minimal assistance;Sit to/from stand       Functional mobility during ADLs: Min guard;Supervision/safety       Vision  no visual deficits              Pertinent Vitals/Pain Pain Assessment: 0-10 Pain Score: 8  Pain Location: R LE Pain Descriptors / Indicators: Aching;Heaviness Pain Intervention(s): Limited activity within patient's tolerance;Monitored during session;Repositioned;RN gave pain meds during session     Hand Dominance Right   Extremity/Trunk Assessment Upper Extremity Assessment Upper Extremity Assessment: Overall WFL for tasks assessed       Cervical / Trunk Assessment Cervical / Trunk Assessment: Normal   Communication Communication Communication: No difficulties   Cognition Arousal/Alertness: Awake/alert Behavior During Therapy: WFL for tasks assessed/performed Overall Cognitive Status: Within Functional Limits for tasks assessed                     General Comments   pt pleasant and cooperative                 Home Living Family/patient expects to be discharged to:: Private residence Living Arrangements: Non-relatives/Friends Available Help at Discharge: Friend(s);Available 24 hours/day Type of Home: Apartment Home Access: Stairs to enter [employer] of Steps: flight Entrance Stairs-Rails: Right;Left Home Layout: One level     Bathroom Shower/Tub: Teacher, early years/pre: Standard     Home Equipment: Crutches          Prior Functioning/Environment  Level of Independence: Independent        Comments: Works at [employer]    OT Diagnosis: Acute pain   OT Problem List: Decreased knowledge of use of DME or AE;Pain   OT Treatment/Interventions: Self-care/ADL training;DME and/or AE instruction;Patient/family education;Therapeutic activities    OT Goals(Current goals can be found in the care plan  section) Acute Rehab OT Goals Patient Stated Goal: go home OT Goal Formulation: With patient Time For Goal Achievement: 11/03/15 Potential to Achieve Goals: Good ADL Goals Pt Will Perform Lower Body Bathing: with min guard assist;with supervision;with set-up;sitting/lateral leans;sit to/from stand Pt Will Perform Lower Body Dressing: with min guard assist;with supervision;with set-up;sitting/lateral leans;sit to/from stand;with caregiver independent in assisting Pt Will Transfer to Toilet: with supervision;with modified independence;regular height toilet Pt Will Perform Toileting - Clothing Manipulation and hygiene: with min guard assist;with supervision;sitting/lateral leans;sit to/from stand  OT Frequency: Min 2X/week   Barriers to D/C:    none                     End of Session Equipment Utilized During Treatment: Other (comment) (crutches)  Activity Tolerance: Patient limited by pain Patient left: in chair;with call bell/phone within reach;with family/visitor present   Time: 1110-1134 OT Time Calculation (min): 24 min Charges:  OT General Charges $OT Visit: 1 Procedure OT Evaluation $OT Eval Moderate Complexity: 1 Procedure OT Treatments $Therapeutic Activity: 8-22 mins G-Codes:    Britt Bottom 10/27/2015, 1:18 PM

## 2015-10-27 NOTE — Evaluation (Signed)
Physical Therapy Evaluation Patient Details Name: Jamie Lowe MRN: [id number] DOB: 1986-01-21 Today's Date: 10/27/2015   History of Present Illness  30 y/o suffered tibia fracture while playing football. Pt s/p R IM nail (10/26/15)  Clinical Impression  Pt admitted with above diagnosis. Pt currently with functional limitations due to the deficits listed below (see PT Problem List). Pt will benefit from skilled PT to increase their independence and safety with mobility to allow discharge to the venue listed below.  Pt educated on TTWB status and ambulated in room, but limited by pain.  Discussed stair negotiation and will check back on pt after pain meds for another session for stair training as he has 1 flight to enter his friend's apartment and 2 flights for his. No follow up or DME needed.     Follow Up Recommendations No PT follow up    Equipment Recommendations  None recommended by PT    Recommendations for Other Services       Precautions / Restrictions Precautions Precautions: None Restrictions Weight Bearing Restrictions: Yes RLE Weight Bearing: Touchdown weight bearing      Mobility  Bed Mobility Overal bed mobility: Modified Independent                Transfers Overall transfer level: Needs assistance   Transfers: Sit to/from Stand Sit to Stand: Min guard         General transfer comment: Friend helping him with handing him crutches, but no physical assistance  Ambulation/Gait Ambulation/Gait assistance: Supervision Ambulation Distance (Feet): 15 Feet Assistive device: Crutches Gait Pattern/deviations:  (hop to pattern) Gait velocity: decreased   General Gait Details: Good TTWB and steady on crutches  Stairs            Wheelchair Mobility    Modified Rankin (Stroke Patients Only)       Balance Overall balance assessment: Needs assistance           Standing balance-Leahy Scale: Poor Standing balance comment: requires UE  support with dynamic activities                             Pertinent Vitals/Pain Pain Assessment: 0-10 Pain Score: 8  Pain Location: R knee to foot Pain Descriptors / Indicators: Sore;Heaviness Pain Intervention(s): Monitored during session;Patient requesting pain meds-RN notified;Ice applied;Limited activity within patient's tolerance    Home Living Family/patient expects to be discharged to:: Private residence Living Arrangements: Non-relatives/Friends Available Help at Discharge: Friend(s);Available 24 hours/day Type of Home: Apartment Home Access: Stairs to enter Entrance Stairs-Rails: Doctor, general practice of Steps: flight Home Layout: One level Home Equipment: Crutches      Prior Function Level of Independence: Independent         Comments: Works at [employer]        Extremity/Trunk Assessment   Upper Extremity Assessment: Defer to OT evaluation           Lower Extremity Assessment: Overall WFL for tasks assessed;RLE deficits/detail RLE Deficits / Details: Ace wrap and some swelling in R foot       Communication   Communication: No difficulties  Cognition Arousal/Alertness: Awake/alert Behavior During Therapy: WFL for tasks assessed/performed Overall Cognitive Status: Within Functional Limits for tasks assessed                      General Comments General comments (skin integrity, edema, etc.): R foot swollen, but  not positioned well in bed.  Elevated it in recliner and pt more comfortable.    Exercises        Assessment/Plan    PT Assessment Patient needs continued PT services  PT Diagnosis Difficulty walking;Acute pain   PT Problem List Decreased mobility;Pain;Decreased knowledge of use of DME  PT Treatment Interventions DME instruction;Gait training;Stair training;Functional mobility training;Therapeutic activities   PT Goals (Current goals can be found in the Care Plan section) Acute  Rehab PT Goals Patient Stated Goal: go home PT Goal Formulation: With patient Time For Goal Achievement: 10/30/15 Potential to Achieve Goals: Good    Frequency Min 5X/week   Barriers to discharge        Co-evaluation               End of Session   Activity Tolerance: Patient limited by pain Patient left: in chair;with call bell/phone within reach;with family/visitor present Nurse Communication: Patient requests pain meds         Time: [id number] PT Time Calculation (min) (ACUTE ONLY): 24 min   Charges:   PT Evaluation $PT Eval Low Complexity: 1 Procedure PT Treatments $Gait Training: 8-22 mins   PT G Codes:        Jamie Lowe LUBECK 10/27/2015, 9:24 AM

## 2015-10-27 NOTE — Progress Notes (Signed)
SPORTS MEDICINE AND JOINT REPLACEMENT  Jamie SpurlingStephen Lucey, MD   [facility] Rob[facility] PA-C [name], [place], [place]  [address]                             [phone]   PROGRESS NOTE  Subjective:  negative for Chest Pain  negative for Shortness of Breath  negative for Nausea/Vomiting   negative for Calf Pain  negative for Bowel Movement   Tolerating Diet: yes         Patient reports pain as 7 on 0-10 scale.    Objective: Vital signs in last 24 hours:   Patient Vitals for the past 24 hrs:  BP Temp Temp src Pulse Resp SpO2  10/27/15 0529 118/73 mmHg 98.2 F (36.8 C) Oral 75 17 97 %  10/27/15 0034 (!) 142/88 mmHg 98.4 F (36.9 C) Oral 62 18 100 %  10/26/15 1934 (!) 166/100 mmHg 97.9 F (36.6 C) Axillary 60 18 100 %  10/26/15 1415 (!) 159/82 mmHg - - 60 18 100 %  10/26/15 1023 (!) 145/92 mmHg 98 F (36.7 C) - - 16 100 %  10/26/15 1005 (!) 155/98 mmHg 97.9 F (36.6 C) - (!) 58 16 100 %  10/26/15 0950 (!) 165/106 mmHg - - (!) 57 13 99 %  10/26/15 0935 (!) 151/100 mmHg 97.8 F (36.6 C) - (!) 59 13 100 %    @flow {1959:LAST@   Intake/Output from previous day:   06/11 0701 - 06/12 0700 In: 1552.5 [P.O.:240; I.V.:1312.5] Out: 450 [Urine:400]   Intake/Output this shift:       Intake/Output      06/11 0701 - 06/12 0700 06/12 0701 - 06/13 0700   P.O. 240    I.V. 1312.5    Total Intake 1552.5     Urine 400    Blood 50    Total Output 450     Net +1102.5          Urine Occurrence 1 x       LABORATORY DATA:  Recent Labs  10/27/15 0309  WBC 9.8  HGB 12.0*  HCT 36.2*  PLT 202    Recent Labs  10/27/15 0309  NA 136  K 3.6  CL 101  CO2 28  BUN <5*  CREATININE 0.94  GLUCOSE 116*  CALCIUM 9.0   No results found for: INR, PROTIME  Examination:  General appearance: alert, cooperative and no distress Extremities: extremities normal, atraumatic, no cyanosis or edema  Wound Exam: clean, dry, intact   Drainage:  None: wound tissue dry  Motor Exam:  Quadriceps and Hamstrings Intact  Sensory Exam: Superficial Peroneal, Deep Peroneal and Tibial normal   Assessment:    1 Day Post-Op  Procedure(s) (LRB): INTRAMEDULLARY (IM) NAIL TIBIAL (Right)  ADDITIONAL DIAGNOSIS:  Active Problems:   Comminuted fracture of shaft of tibia   Tibia fracture  Acute Blood Loss Anemia   Plan: Physical Therapy as ordered Touch Down Weight Bearing (TDWB)  DVT Prophylaxis:  None  DISCHARGE PLAN: Home  Patient doing well. Touch down weight bearing with crutches, will D/C today and Follow up in office on Thursday.          Jamie Lowe 10/27/2015, 7:12 AM

## 2015-10-27 NOTE — Progress Notes (Signed)
Pt discharge education and instructions completed with pt and girlfriend at bedside; both voices understanding and denies any questions. Pt IV removed; RLE incision dsg remains clean, dry and intact. Pt discharge home with girlfriend to transport him home. Pt handed his prescription for Norco. Pt home crutches delivered to pt at bedside but pt declines his home tub/shower bench recommended by OT. Pt requested for letter for his job; PA [employer] notified and he said to give letter to pt at his post-follow up appoint. Pt transported off unit via wheelchair with belongings and girlfriend to the side. Arabella MerlesP. Amo Tyge Somers RN.

## 2015-10-27 NOTE — Progress Notes (Signed)
Physical Therapy Treatment Patient Details Name: Jamie FiscalSteven Lowe MRN: [id number] DOB: 1986-01-04 Today's Date: 10/27/2015    History of Present Illness 30 y/o suffered tibia fracture while playing football. Pt s/p R IM nail (10/26/15)    PT Comments    Performed stair training on practice stairs for 2nd session of the day.  He did well and should manage flight at home well.  If pt is still at hospital tomorrow, practice on flight.  Pt in too much pain and did not want to do flight today.   Follow Up Recommendations  No PT follow up     Equipment Recommendations  None recommended by PT    Recommendations for Other Services       Precautions / Restrictions Precautions Precautions: None Restrictions Weight Bearing Restrictions: Yes RLE Weight Bearing: Touchdown weight bearing    Mobility  Bed Mobility                 Transfers Overall transfer level: Needs assistance Equipment used: Crutches Transfers: Sit to/from Stand Sit to Stand: Supervision         General transfer comment: Friend helping him with handing him crutches, but no physical assistance  Ambulation/Gait Ambulation/Gait assistance: Supervision Ambulation Distance (Feet): 150 Feet Assistive device: Crutches Gait Pattern/deviations:  (hop to) Gait velocity: decreased   General Gait Details: Good TDWB, but in some pain, but able to continue ambulation   Stairs Stairs: Yes Stairs assistance: Supervision Stair Management: One rail Right;With crutches;Forwards Number of Stairs: 2 General stair comments: Pt did practice stairs without difficulty, but did not do flight to avoid fatigue with pending dc and due to pain level  Wheelchair Mobility    Modified Rankin (Stroke Patients Only)       Balance Overall balance assessment: Needs assistance           Standing balance-Leahy Scale: Fair Standing balance comment: requires UE support with dynamic activities                     Cognition Arousal/Alertness: Awake/alert Behavior During Therapy: WFL for tasks assessed/performed Overall Cognitive Status: Within Functional Limits for tasks assessed                      Exercises      General Comments General comments (skin integrity, edema, etc.): friend present and observed session      Pertinent Vitals/Pain Pain Assessment: 0-10 Pain Score: 8  Pain Location: R lower leg Pain Descriptors / Indicators: Heaviness Pain Intervention(s): Limited activity within patient's tolerance;Monitored during session;Premedicated before session    Home Living                      Prior Function            PT Goals (current goals can now be found in the care plan section) Acute Rehab PT Goals Patient Stated Goal: go home PT Goal Formulation: With patient Time For Goal Achievement: 10/30/15 Potential to Achieve Goals: Good Progress towards PT goals: Progressing toward goals    Frequency  Min 5X/week    PT Plan Current plan remains appropriate    Co-evaluation             End of Session Equipment Utilized During Treatment: Gait belt Activity Tolerance: Patient tolerated treatment well;Patient limited by pain Patient left: in chair;with call bell/phone within reach;with family/visitor present     Time: 4098-11911131-1148 PT Time Calculation (min) (ACUTE ONLY):  17 min  Charges:  $Gait Training: 8-22 mins                    G Codes:      Jamie Lowe 10/27/2015, 1:03 PM

## 2015-10-28 NOTE — Discharge Summary (Signed)
SPORTS MEDICINE & JOINT REPLACEMENT   Georgena SpurlingStephen Lucey, MD    [facility] Robbins, PA-C [address], [place], [place]  [address]                             [phone]  PATIENT ID: Jamie FiscalSteven Sublett        MRN:  [id number]          DOB/AGE: 1985/08/05 / 30 y.o.    DISCHARGE SUMMARY  ADMISSION DATE:    10/25/2015 DISCHARGE DATE:   10/27/2015   ADMISSION DIAGNOSIS: Comminuted fracture of shaft of tibia [S82.253A]    DISCHARGE DIAGNOSIS:  Tibial Fracture Right    ADDITIONAL DIAGNOSIS: Active Problems:   Comminuted fracture of shaft of tibia   Tibia fracture  Past Medical History  Diagnosis Date  . STD (male)     PROCEDURE: Procedure(s): INTRAMEDULLARY (IM) NAIL TIBIAL on 10/25/2015 - 10/26/2015  CONSULTS:     HISTORY:  See H&P in chart  HOSPITAL COURSE:  Jamie Lowe is a 30 y.o. admitted on 10/25/2015 and found to have a diagnosis of Tibial Fracture Right.  After appropriate laboratory studies were obtained  they were taken to the operating room on 10/25/2015 - 10/26/2015 and underwent Procedure(s): INTRAMEDULLARY (IM) NAIL TIBIAL.   They were given perioperative antibiotics:  Anti-infectives    Start     Dose/Rate Route Frequency Ordered Stop   10/26/15 0800  ceFAZolin (ANCEF) IVPB 2g/100 mL premix     2 g 200 mL/hr over 30 Minutes Intravenous To Short Stay 10/26/15 0327 10/26/15 0806    .  Patient given tranexamic acid IV or topical and exparel intra-operatively.  Tolerated the procedure well.    POD# 1: Vital signs were stable.  Patient denied Chest pain, shortness of breath, or calf pain.  Patient was started on Lovenox 30 mg subcutaneously twice daily at 8am.  Consults to PT, OT, and care management were made.  The patient was weight bearing as tolerated.  CPM was placed on the operative leg 0-90 degrees for 6-8 hours a day. When out of the CPM, patient was placed in the foam block to achieve full extension. Incentive spirometry was taught.  Dressing was changed.        POD #2, Continued  PT for ambulation and exercise program.  IV saline locked.  O2 discontinued.    The remainder of the hospital course was dedicated to ambulation and strengthening.   The patient was discharged on 1 day post op in  Good condition.  Blood products given:none  DIAGNOSTIC STUDIES: Recent vital signs: No data found.      Recent laboratory studies:  Recent Labs  10/27/15 0309  WBC 9.8  HGB 12.0*  HCT 36.2*  PLT 202    Recent Labs  10/27/15 0309  NA 136  K 3.6  CL 101  CO2 28  BUN <5*  CREATININE 0.94  GLUCOSE 116*  CALCIUM 9.0   No results found for: INR, PROTIME   Recent Radiographic Studies :  Dg Tibia/fibula Right  10/26/2015  CLINICAL DATA:  Open detection internal fixation right distal tibial fracture EXAM: DG C-ARM 61-120 MIN; RIGHT TIBIA AND FIBULA - 2 VIEW COMPARISON:  10/25/2015 FINDINGS: The patient is status post open reduction internal fixation of distal tibial fracture. There is intra medullary rod in right tibia. There is near anatomic alignment. Again noted mild displaced fracture proximal shaft of the right fibula. IMPRESSION: There is  intramedullary rod in right tibia. There is near anatomic alignment of distal tibial fracture. Again noted mild displaced fracture proximal shaft of the right fibula. Fluoroscopy time was 1 minute and 6 seconds. Please see the operative report. Electronically Signed   By: Natasha Mead M.D.   On: 10/26/2015 10:07   Dg Tibia/fibula Right  10/25/2015  CLINICAL DATA:  R tib/fib deformity playing football X today EXAM: RIGHT TIBIA AND FIBULA - 2 VIEW COMPARISON:  None. FINDINGS: Comminuted fracture junction of proximal and middle thirds fibular shaft with medial displacement of distal fracture fragment. Comminuted fracture distal shaft of the tibia with lateral displacement of distal fracture fragment. IMPRESSION: Comminuted fractures of the fibula and tibia Electronically Signed   By: Esperanza Heir M.D.   On:  10/25/2015 20:33   Dg C-arm 1-60 Min  10/26/2015  CLINICAL DATA:  Open detection internal fixation right distal tibial fracture EXAM: DG C-ARM 61-120 MIN; RIGHT TIBIA AND FIBULA - 2 VIEW COMPARISON:  10/25/2015 FINDINGS: The patient is status post open reduction internal fixation of distal tibial fracture. There is intra medullary rod in right tibia. There is near anatomic alignment. Again noted mild displaced fracture proximal shaft of the right fibula. IMPRESSION: There is intramedullary rod in right tibia. There is near anatomic alignment of distal tibial fracture. Again noted mild displaced fracture proximal shaft of the right fibula. Fluoroscopy time was 1 minute and 6 seconds. Please see the operative report. Electronically Signed   By: Natasha Mead M.D.   On: 10/26/2015 10:07    DISCHARGE INSTRUCTIONS: Discharge Instructions    Call MD / Call 911    Complete by:  As directed   If you experience chest pain or shortness of breath, CALL 911 and be transported to the hospital emergency room.  If you develope a fever above 101 F, pus (white drainage) or increased drainage or redness at the wound, or calf pain, call your surgeon's office.     Constipation Prevention    Complete by:  As directed   Drink plenty of fluids.  Prune juice may be helpful.  You may use a stool softener, such as Colace (over the counter) 100 mg twice a day.  Use MiraLax (over the counter) for constipation as needed.     Diet - low sodium heart healthy    Complete by:  As directed      Discharge instructions    Complete by:  As directed   Keep would clean/dry  Touch down weight bearing with crutches  Follow up in office on thursday     Increase activity slowly as tolerated    Complete by:  As directed            DISCHARGE MEDICATIONS:     Medication List    TAKE these medications        HYDROcodone-acetaminophen 7.5-325 MG tablet  Commonly known as:  NORCO  Take 1 tablet by mouth every 6 (six) hours.         FOLLOW UP VISIT:       Follow-up Information    Follow up with Raymon Mutton, MD. Call on 10/30/2015.   Specialty:  Orthopedic Surgery   Contact information:   [address] [place] [address] [phone]       DISPOSITION: HOME VS. SNF  CONDITION:  Good   Guy Sandifer 10/28/2015, 1:23 PM

## 2015-10-28 NOTE — Op Note (Signed)
[facility]:  Lowe, Jamie              ACCOUNT NO.:  [id number]  MEDICAL RECORD NO.:  [id number]  LOCATION:  5N11C                        FACILITY:  [facility]  PHYSICIAN:  Mila HomerStephen D. Sherlean FootLucey, M.D. DATE OF BIRTH:  1985-06-07  DATE OF PROCEDURE:  10/26/2015 DATE OF DISCHARGE:  10/27/2015                              OPERATIVE REPORT   SURGEON:  Mila HomerStephen D. Sherlean FootLucey, M.D.  ASSISTANT:  Windell Norfolkolby Robins, PA-C.  ANESTHESIA:  General.  PREOPERATIVE DIAGNOSIS:  Right tibia and fibula fracture.  POSTOPERATIVE DIAGNOSIS:  Right tibia and fibula fracture.  PROCEDURE:  Right intramedullary nail of the tibia.  INDICATION FOR PROCEDURE:  The patient is a 30 year old, black male, presented to the ER with right leg pain after a recreational football injury at party.  X-rays revealed a comminuted distal tibial shaft fracture and proximal nondisplaced fibular fracture.  Informed consent was obtained for surgery.  DESCRIPTION OF PROCEDURE:  The patient was laid supine, administered general anesthesia.  The right leg prepped and draped in the usual fashion.  The Esmarch was used to exsanguinate the extremity, and the tourniquet was inflated to 300 mmHg.  The knee was flexed to 90 degrees under a knee triangle.  A 3 cm incision was made just medial to the patellar tendon.  Access was gained to the proximal tibial flare.  A 2.0 mm guidewire was inserted and then checked under C-arm imaging to ensure appropriate orientation on AP and lateral imaging.  I then over-reamed that with a 13 mm reamer.  I then placed down a reduction wire and reamed up to 10 mm and then placed a 9 x 37 cm nail down crossing the fracture after reaming.  I locked it proximally with a static screw using the outrigger device.  I locked it distally with a medial to lateral static screw using the perfect circle technique.  I checked images on AP and lateral to ensure anatomic reduction and appropriate hardware length.  I then irrigated and  closed with 0 and 2-0 Vicryl in the knee incision.  4-0 nylons in the stab incisions.  They were then dressed with Xeroform, dressing sponges, sterile Webril, and an Ace wrap.  Complications none.  Drains none.          ______________________________ Mila HomerStephen D. Sherlean FootLucey, M.D.     SDL/MEDQ  D:  10/28/2015  T:  10/28/2015  Job:  [id number]

## 2015-10-28 NOTE — Op Note (Signed)
Dictation Number:  [id number]

## 2015-10-29 ENCOUNTER — Encounter (HOSPITAL_COMMUNITY): Payer: Self-pay | Admitting: Orthopedic Surgery

## 2015-10-31 ENCOUNTER — Encounter (HOSPITAL_COMMUNITY): Payer: Self-pay | Admitting: Orthopedic Surgery

## 2015-11-09 ENCOUNTER — Telehealth (HOSPITAL_COMMUNITY): Payer: Self-pay

## 2015-11-09 ENCOUNTER — Encounter (HOSPITAL_COMMUNITY): Payer: Self-pay

## 2015-11-09 ENCOUNTER — Emergency Department (HOSPITAL_BASED_OUTPATIENT_CLINIC_OR_DEPARTMENT_OTHER)
Admit: 2015-11-09 | Discharge: 2015-11-09 | Disposition: A | Discharge status: Home / Self Care | Payer: Self-pay | Attending: Emergency Medicine | Admitting: Emergency Medicine

## 2015-11-09 ENCOUNTER — Emergency Department (HOSPITAL_COMMUNITY)
Admission: EM | Admit: 2015-11-09 | Discharge: 2015-11-09 | Disposition: A | Discharge status: Home / Self Care | Payer: Self-pay | Attending: Emergency Medicine | Admitting: Emergency Medicine

## 2015-11-09 DIAGNOSIS — M79605 Pain in left leg: Secondary | ICD-10-CM

## 2015-11-09 DIAGNOSIS — F17210 Nicotine dependence, cigarettes, uncomplicated: Secondary | ICD-10-CM | POA: Insufficient documentation

## 2015-11-09 DIAGNOSIS — M79609 Pain in unspecified limb: Secondary | ICD-10-CM

## 2015-11-09 DIAGNOSIS — M25571 Pain in right ankle and joints of right foot: Secondary | ICD-10-CM | POA: Insufficient documentation

## 2015-11-09 DIAGNOSIS — M7989 Other specified soft tissue disorders: Secondary | ICD-10-CM

## 2015-11-09 MED ORDER — TRAMADOL HCL 50 MG PO TABS: 50.0000 mg | ORAL_TABLET | Freq: Four times a day (QID) | ORAL | Status: DC | PRN

## 2015-11-09 MED ORDER — OXYCODONE-ACETAMINOPHEN 5-325 MG PO TABS
1.0000 | ORAL_TABLET | Freq: Once | ORAL | Status: AC
Start: 2015-11-09 — End: 2015-11-09
  Administered 2015-11-09: 1 via ORAL
  Filled 2015-11-09: qty 1

## 2015-11-09 NOTE — Discharge Instructions (Signed)
Please read and follow all provided instructions.  Your diagnoses today include:  1. Pain of left lower extremity    Tests performed today include:  Vital signs. See below for your results today.   Medications prescribed:   Take as prescribed   Home care instructions:  Follow any educational materials contained in this packet.  You can use Ibuprofen 400mg  combined with Tylenol 1000mg  for pain relief every 6 hours. Do not exceed 4g of Tylenol in one 24 hour period. Use narcotics if pain uncontrolled with the aforementioned regiment. Do not exceed 10 days of this therapy   Follow-up instructions: Please follow-up with your Orthopedic Doctor for further evaluation of symptoms and treatment at your scheduled appointment   Return instructions:   Please return to the Emergency Department if you do not get better, if you get worse, or new symptoms OR  - Fever (temperature greater than 101.55F)  - Bleeding that does not stop with holding pressure to the area    -Severe pain (please note that you may be more sore the day after your accident)  - Chest Pain  - Difficulty breathing  - Severe nausea or vomiting  - Inability to tolerate food and liquids  - Passing out  - Skin becoming red around your wounds  - Change in mental status (confusion or lethargy)  - New numbness or weakness     Please return if you have any other emergent concerns.  Additional Information:  Your vital signs today were: BP 163/90 mmHg   Pulse 102   Temp(Src) 97.6 F (36.4 C) (Oral)   Resp 18   SpO2 100% If your blood pressure (BP) was elevated above 135/85 this visit, please have this repeated by your doctor within one month. ---------------

## 2015-11-09 NOTE — Progress Notes (Signed)
VASCULAR LAB PRELIMINARY  PRELIMINARY  PRELIMINARY  PRELIMINARY  Right lower extremity venous duplex has been completed.     Right:  No evidence of DVT, superficial thrombosis, or Baker's cyst.  Gave results to HartAudrey, RN  Jenetta Logesami Amazing Cowman, RVT, RDMS 11/09/2015, 2:36 PM
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
## 2015-11-09 NOTE — Telephone Encounter (Signed)
Pharmacy calling for verbal verification of Ultram Rx written today by Corrie Mckusick. Mohr PA, Rx was not signed.  Verbal verification given

## 2015-11-21 ENCOUNTER — Emergency Department (HOSPITAL_COMMUNITY)
Admission: EM | Admit: 2015-11-21 | Discharge: 2015-11-21 | Disposition: A | Discharge status: Home / Self Care | Payer: MEDICAID | Attending: Emergency Medicine | Admitting: Emergency Medicine

## 2015-11-21 ENCOUNTER — Encounter (HOSPITAL_COMMUNITY): Payer: Self-pay | Admitting: Emergency Medicine

## 2015-11-21 DIAGNOSIS — F17210 Nicotine dependence, cigarettes, uncomplicated: Secondary | ICD-10-CM | POA: Insufficient documentation

## 2015-11-21 DIAGNOSIS — Z202 Contact with and (suspected) exposure to infections with a predominantly sexual mode of transmission: Secondary | ICD-10-CM | POA: Insufficient documentation

## 2015-11-21 LAB — URINALYSIS, ROUTINE W REFLEX MICROSCOPIC
Bilirubin Urine: NEGATIVE
Glucose, UA: NEGATIVE mg/dL
Hgb urine dipstick: NEGATIVE
KETONES UR: NEGATIVE mg/dL
LEUKOCYTES UA: NEGATIVE
NITRITE: NEGATIVE
PROTEIN: NEGATIVE mg/dL
Specific Gravity, Urine: 1.03 (ref 1.005–1.030)
pH: 5.5 (ref 5.0–8.0)

## 2015-11-21 NOTE — ED Notes (Signed)
Pt wants STD testing. Denies known exposure or sx, just wants testing. Didn't want to wait until Tuesday to [facility]

## 2015-11-21 NOTE — ED Provider Notes (Signed)
CSN: [id number]     Arrival date & time 11/21/15  1100 History  By signing my name below, I, Evon Slackerrance Branch, attest that this documentation has been prepared under the direction and in the presence of Bethel BornKelly Marie Laketia Vicknair, PA-C. Electronically Signed: Evon Slackerrance Branch, ED Scribe. 11/21/2015. 12:19 PM.     Chief Complaint  Patient presents with  . Exposure to STD   The history is provided by the patient. No language interpreter was used.   HPI Comments: Jamie Lowe is a 30 y.o. male who presents to the Emergency Department for a STD check. Pt states that he just want to get checked because he reports recently having unprotected sex with a new partner "who is not his girlfriend". Denies fever, abdominal pain, flank pain, discharge, penile pain, testicular pain, dysuria. Pt reports Hx of chlamydia several years prior.   Past Medical History  Diagnosis Date  . STD (male)    Past Surgical History  Procedure Laterality Date  . Knee sugery (left)      Pt states "I think it was a ligament tear"  . Tibia im nail insertion Right 10/26/2015    Procedure: INTRAMEDULLARY (IM) NAIL TIBIAL;  Surgeon: Dannielle HuhSteve Lucey, MD;  Location: [facility] OR;  Service: Orthopedics;  Laterality: Right;   No family history on file. Social History  Substance Use Topics  . Smoking status: Current Every Day Smoker    Types: Cigars  . Smokeless tobacco: None  . Alcohol Use: Yes    Review of Systems  Gastrointestinal: Negative for abdominal pain.  Genitourinary: Negative for dysuria, discharge and penile pain.      Allergies  Review of patient's allergies indicates no known allergies.  Home Medications   Prior to Admission medications   Medication Sig Start Date End Date Taking? Authorizing Provider  HYDROcodone-acetaminophen (NORCO) 7.5-325 MG tablet Take 1 tablet by mouth every 6 (six) hours. 10/27/15   Guy Sandiferolby Alan Robbins, PA  traMADol (ULTRAM) 50 MG tablet Take 1 tablet (50 mg total) by mouth every 6 (six) hours as  needed. 11/09/15   Audry Piliyler Mohr, PA-C   BP 119/78 mmHg  Pulse 102  Temp(Src) 98.3 F (36.8 C) (Oral)  Resp 16  SpO2 99%   Physical Exam  Constitutional: He is oriented to person, place, and time. He appears well-developed and well-nourished. No distress.  HENT:  Head: Normocephalic and atraumatic.  Eyes: Conjunctivae are normal. Pupils are equal, round, and reactive to light. Right eye exhibits no discharge. Left eye exhibits no discharge. No scleral icterus.  Neck: Normal range of motion.  Cardiovascular: Normal rate.   Pulmonary/Chest: Effort normal. No respiratory distress.  Abdominal: Soft. He exhibits no distension.  Genitourinary:  GU exam deferred: Pt asymptomatic  Neurological: He is alert and oriented to person, place, and time.  Skin: Skin is warm and dry.  Psychiatric: He has a normal mood and affect.    ED Course  Procedures (including critical care time) DIAGNOSTIC STUDIES: Oxygen Saturation is 99% on RA, normal by my interpretation.    COORDINATION OF CARE: 12:18 PM-Discussed treatment plan which includes STD testing with pt at bedside and pt agreed to plan.     Labs Review Labs Reviewed  URINALYSIS, ROUTINE W REFLEX MICROSCOPIC (NOT AT [facility])  RPR  HIV ANTIBODY (ROUTINE TESTING)  GC/CHLAMYDIA PROBE AMP ([place]) NOT AT [facility]    Imaging Review No results found.    EKG Interpretation None      MDM   Final diagnoses:  Possible  exposure to STD   30 year old male presents for STD check. UA shows no overt signs of infection. G&C, RPR, HIV collected and sent. Pt's phone number is UTD and advised he would be called if results are abnormal. Counseled safe sex practices. Patient is NAD, non-toxic, with stable VS. Patient is informed of clinical course, understands medical decision making process, and agrees with plan. Opportunity for questions provided and all questions answered. Return precautions given.   I personally performed the services described  in this documentation, which was scribed in my presence. The recorded information has been reviewed and is accurate.      Bethel BornKelly Marie Catalia Massett, PA-C 11/21/15 1435  Geoffery Lyonsouglas Delo, MD 11/21/15 1540

## 2015-11-22 LAB — RPR: RPR Ser Ql: NONREACTIVE

## 2015-11-22 LAB — HIV ANTIBODY (ROUTINE TESTING W REFLEX): HIV Screen 4th Generation wRfx: NONREACTIVE

## 2015-12-16 ENCOUNTER — Encounter: Payer: Self-pay | Admitting: Physical Therapy

## 2015-12-16 ENCOUNTER — Ambulatory Visit: Payer: Self-pay | Attending: Orthopedic Surgery | Admitting: Physical Therapy

## 2015-12-16 DIAGNOSIS — M25562 Pain in left knee: Secondary | ICD-10-CM | POA: Insufficient documentation

## 2015-12-16 DIAGNOSIS — M25572 Pain in left ankle and joints of left foot: Secondary | ICD-10-CM | POA: Insufficient documentation

## 2015-12-16 DIAGNOSIS — M6281 Muscle weakness (generalized): Secondary | ICD-10-CM | POA: Insufficient documentation

## 2015-12-16 DIAGNOSIS — M25571 Pain in right ankle and joints of right foot: Secondary | ICD-10-CM | POA: Insufficient documentation

## 2015-12-16 DIAGNOSIS — R262 Difficulty in walking, not elsewhere classified: Secondary | ICD-10-CM | POA: Insufficient documentation

## 2015-12-16 DIAGNOSIS — M25561 Pain in right knee: Secondary | ICD-10-CM | POA: Insufficient documentation

## 2015-12-17 NOTE — Addendum Note (Signed)
Addended by: Dessie Coma on: 12/17/2015 10:46 AM   Modules accepted: Orders

## 2015-12-17 NOTE — Therapy (Signed)
[facility] [address] [place], [place], [address] Phone: [phone]   Fax:  [phone]  Physical Therapy Treatment  Patient Details  Name: Jamie Lowe MRN: [id number] Date of Birth: 1985/12/15 Referring Provider: Dr Georgena Spurling   Encounter Date: 12/16/2015      PT End of Session - 12/17/15 0907    Visit Number 1   Number of Visits 12   Date for PT Re-Evaluation 01/28/16   Authorization Type Private pay at this time   PT Start Time 1415   PT Stop Time 1511   PT Time Calculation (min) 56 min   Equipment Utilized During Treatment Gait belt   Activity Tolerance Patient tolerated treatment well   Behavior During Therapy [facility] for tasks assessed/performed      Past Medical History:  Diagnosis Date  . STD (male)     Past Surgical History:  Procedure Laterality Date  . knee sugery (left)     Pt states "I think it was a ligament tear"  . TIBIA IM NAIL INSERTION Right 10/26/2015   Procedure: INTRAMEDULLARY (IM) NAIL TIBIAL;  Surgeon: Dannielle Huh, MD;  Location: [facility] OR;  Service: Orthopedics;  Laterality: Right;    There were no vitals filed for this visit.      Subjective Assessment - 12/16/15 1427    Subjective Patient was palying football on 10/26/15 when he was hit. He proke his distal tibia and proximal fibula. He had an ORIF on 10-27-15. Since that point he has been out of work and on cruthces. He has sharp pains when he walks but feels like the pain is improving.    Pertinent History Nothing significant    Limitations Sitting;Walking;Standing   How long can you sit comfortably? N/A   How long can you stand comfortably? > 5 minutes    How long can you walk comfortably? household ditances/ Limited community    Diagnostic tests Nothing post op. Per patien last x-ray showed 75% healing    Patient Stated Goals To return to work as soon as possible.    Currently in Pain? Yes   Pain Score 5    Pain Orientation Right    Pain Descriptors / Indicators Aching;Sharp;Shooting   Pain Type Surgical pain   Pain Onset More than a month ago   Pain Frequency Constant   Aggravating Factors  standing, walking    Pain Relieving Factors rest,    Effect of Pain on Daily Activities can not work, difficulty working   Multiple Pain Sites No            [facility] PT Assessment - 12/17/15 0001      Assessment   Medical Diagnosis right Tibia / prox fibula fx    Referring Provider Dr Georgena Spurling    Onset Date/Surgical Date 10/27/15   Next MD Visit 01/11/2015    Prior Therapy No     Precautions   Precautions None     Restrictions   Weight Bearing Restrictions Yes   Other Position/Activity Restrictions WBAT      Home Environment   Additional Comments Patient lives on the third floor of an apartment      Prior Function   Level of Independence Independent   Vocation Full time employment   Vocation 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    Leisure Get back to the gym      Cognition   Overall Cognitive Status Within Functional Limits for tasks assessed  Observation/Other Assessments   Focus on Therapeutic Outcomes (FOTO)  Ankle surgery      Sensation   Light Touch Appears Intact     Functional Tests   Functional tests Other     Other:   Other/ Comments Unable to perform single leg stance test      Posture/Postural Control   Posture Comments Normal posture      AROM   Overall AROM Comments Full R active ankle and knee range of motion      PROM   Overall PROM Comments full passive R ankle foot passive ROM      Strength   Strength Assessment Site Hip;Knee;Ankle   Right/Left Hip Right;Left   Right Hip Flexion 4/5   Right Hip Extension 5/5   Right Hip ABduction 4+/5   Right Hip ADduction 5/5   Left Hip Flexion 5/5   Left Hip Extension 5/5   Left Hip ABduction 4+/5   Right/Left Knee Left;Right   Right Knee Flexion 4+/5   Right Knee Extension 4/5   Left Knee Flexion 5/5   Left  Knee Extension 5/5   Right/Left Ankle Left   Left Ankle Dorsiflexion 4+/5   Left Ankle Plantar Flexion 4/5   Left Ankle Inversion 5/5   Left Ankle Eversion 5/5     Palpation   Palpation comment No significant tenderness to palpation      Ambulation/Gait   Gait Comments ambulates with single point cane, bilateral toe out, limited heel strike on right side.      High Level Balance   High Level Balance Comments unable to assess high level balance 2nd to pain in weight bearing                      [facility] Adult PT Treatment/Exercise - 12/17/15 0001      Self-Care   Self-Care Other Self-Care Comments   Other Self-Care Comments  Patient education pon symptom nmangement at home as well as exercise pogression. The patient may need to do a protion of his treatment at home depending on his insurance situation.      Lumbar Exercises: Standing   Other Standing Lumbar Exercises standing weight shift forward and side 2x10;    Other Standing Lumbar Exercises Standing march 2x10      Lumbar Exercises: Seated   LAQ on Chair Limitations 2x10 yellow band    Other Seated Lumbar Exercises Seated knee flexion with yellow t-band 2x10.    Other Seated Lumbar Exercises 4 way andkle with t-band 2x10      Lumbar Exercises: Supine   Bridge Limitations 2x10    Straight Leg Raises Limitations 2x10      Lumbar Exercises: Sidelying   Hip Abduction Limitations 2x10    Other Sidelying Lumbar Exercises prone straight leg raise 2x10                 PT Education - 12/17/15 0854    Education provided Yes   Education Details significant education provi   Person(s) Educated Patient   Methods Explanation   Comprehension Verbalized understanding;Returned demonstration;Tactile cues required          PT Short Term Goals - 12/17/15 0919      PT SHORT TERM GOAL #1   Title Patientwill increase right single leg stance time to 10 seconds    Time 4   Period Weeks   Status New     PT  SHORT TERM GOAL #2   Title Patient  will increase gross right ankle strength to 5/5    Time 4   Period Weeks   Status New     PT SHORT TERM GOAL #3   Title Patient will increase gross right hip and knee strength to 5/5    Time 4   Period Weeks   Status New     PT SHORT TERM GOAL #4   Title Patient will report 2x10 pain at worst in the right lower extrmeity    Time 4   Period Weeks   Status New     PT SHORT TERM GOAL #5   Title Patient will ambulate 300' in clinic without single point cane   Time 4   Period Weeks   Status New           PT Long Term Goals - 12/17/15 1610      PT LONG TERM GOAL #1   Title Patient will ambualte 1 mile without increase pain in order to return to work    Time 8   Period Weeks   Status New     PT LONG TERM GOAL #2   Title Patient will stand for 1 hour without icreased pain in order to return to work    Time 8   Period Weeks   Status New     PT LONG TERM GOAL #3   Title Patient will bend to pick up 2o lb box without pain in order to returnt ot work.    Time 8   Period Weeks   Status New               Plan - 12/17/15 0912    Clinical Impression Statement Patient is a 30 year old male S/P right tib/fib fracture and ORIF. He presents with an antalgic gait, decreased strength, and increased pain in weight bearing. He was given a home exercise program to improve weight bearing and strength. He is using 1 crutch at this time for gait. He is private pay but is looking into going onto medicaid. He would benefit from furter skilled therapy at this time.    Rehab Potential Excellent   PT Frequency 2x / week   PT Duration 8 weeks   PT Treatment/Interventions ADLs/Self Care Home Management;Electrical Stimulation;Iontophoresis /ml Dexamethasone;Cryotherapy;Functional mobility training;Stair training;Gait training;Ultrasound;Moist Heat;Therapeutic activities;Therapeutic exercise;Neuromuscular re-education;Cognitive  remediation;Patient/family education;Passive range of motion;Manual techniques;Dry needling;Splinting;Taping   PT Next Visit Plan continue to progress weught bearing as tolerated. Add squats, leg press, baps board, seated or standing rocker borad, work on gait without the crutch.,    PT Home Exercise Plan ankle 4 way, SLR, bridging, 3 way SLR; standing march, standing weight shift   Consulted and Agree with Plan of Care Family member/caregiver      Patient will benefit from skilled therapeutic intervention in order to improve the following deficits and impairments:  Decreased range of motion, Difficulty walking, Decreased mobility, Decreased strength, Decreased activity tolerance, Pain, Decreased endurance, Abnormal gait  Visit Diagnosis: Difficulty in walking, not elsewhere classified  Muscle weakness (generalized)  Pain in right knee  Pain in right ankle and joints of right foot     Problem List Patient Active Problem List   Diagnosis Date Noted  . Tibia fracture 10/26/2015  . Comminuted fracture of shaft of tibia 10/25/2015    Dessie Coma PT DPT  12/17/2015, 10:42 AM  [facility] [address] [place], [place], [address] Phone: [phone]   Fax:  [phone]  Name: Jamie Lowe MRN: [id number] Date of Birth: 09-Oct-1985

## 2016-01-05 ENCOUNTER — Ambulatory Visit: Payer: Self-pay | Admitting: Physical Therapy

## 2016-01-14 ENCOUNTER — Ambulatory Visit: Payer: Self-pay | Admitting: Physical Therapy

## 2016-01-14 DIAGNOSIS — M25571 Pain in right ankle and joints of right foot: Secondary | ICD-10-CM

## 2016-01-14 DIAGNOSIS — M6281 Muscle weakness (generalized): Secondary | ICD-10-CM

## 2016-01-14 DIAGNOSIS — M25561 Pain in right knee: Secondary | ICD-10-CM

## 2016-01-14 DIAGNOSIS — R262 Difficulty in walking, not elsewhere classified: Secondary | ICD-10-CM

## 2016-01-14 NOTE — Therapy (Addendum)
[place], [place], [address] Phone: [phone]   Fax:  [phone]  Physical Therapy Treatment  Patient Details  Name: Jamie Lowe MRN: [id number] Date of Birth: Mar 01, 1986 Referring Provider: Dr Lara Mulch   Encounter Date: 01/14/2016      PT End of Session - 01/14/16 1322    Visit Number 2   Number of Visits 12   Date for PT Re-Evaluation 01/28/16   Authorization Type Private pay at this time   PT Start Time 0935   PT Stop Time 1018   PT Time Calculation (min) 43 min   Activity Tolerance Patient tolerated treatment well   Behavior During Therapy [facility] for tasks assessed/performed      Past Medical History:  Diagnosis Date  . STD (male)     Past Surgical History:  Procedure Laterality Date  . knee sugery (left)     Pt states "I think it was a ligament tear"  . TIBIA IM NAIL INSERTION Right 10/26/2015   Procedure: INTRAMEDULLARY (IM) NAIL TIBIAL;  Surgeon: Vickey Huger, MD;  Location: [facility];  Service: Orthopedics;  Laterality: Right;    There were no vitals filed for this visit.      Subjective Assessment - 01/14/16 0940    Subjective Patient has been back to the MD. He has no been back for a month but he has been doing all his exercises 2x per day. He comes in today with an antalgic gait. he is not putting weight on the left lower extremity.    Pertinent History Nothing significant    Limitations Sitting;Walking;Standing   How long can you sit comfortably? N/A   How long can you stand comfortably? > 5 minutes    How long can you walk comfortably? household ditances/ Limited community    Diagnostic tests Nothing post op. Per patien last x-ray showed 75% healing    Patient Stated Goals To return to work as soon as possible.    Currently in Pain? Yes   Pain Score 5    Pain Location Foot   Pain Orientation Right   Pain Descriptors / Indicators Aching;Sharp   Pain Type Surgical pain    Pain Onset More than a month ago   Pain Frequency Constant   Aggravating Factors  standing, walking,    Pain Relieving Factors rest   Multiple Pain Sites No                         [facility] Adult PT Treatment/Exercise - 01/14/16 0001      Lumbar Exercises: Standing   Other Standing Lumbar Exercises Mini squats, 1/2 lunges, step up 4 inch x20 4 inch lateral step up x20; left foot single leg medicine ball toss x20 yellow ball, Standing windmill 2x10 on left to learn technique; sinlge leg stance with right 3x15seconds with UE support; heel raises x20    Other Standing Lumbar Exercises Machine leg press 40lb 2x15; heel raises 2x15                   PT Short Term Goals - 01/14/16 1329      PT SHORT TERM GOAL #1   Title Patient will increase right single leg stance time to 10 seconds    Time 4   Period Weeks   Status On-going     PT SHORT TERM GOAL #2   Title Patient will increase gross right ankle strength to 5/5  Time 4   Period Weeks   Status On-going     PT SHORT TERM GOAL #3   Title Patient will increase gross right hip and knee strength to 5/5    Time 4   Period Weeks   Status On-going     PT SHORT TERM GOAL #4   Title Patient will report 2x10 pain at worst in the right lower extrmeity    Time 4   Period Weeks   Status On-going     PT SHORT TERM GOAL #5   Title Patient will ambulate 300' in clinic without single point cane   Time 4   Period Weeks   Status On-going           PT Long Term Goals - 12/17/15 9518      PT LONG TERM GOAL #1   Title Patient will ambualte 1 mile without increase pain in order to return to work    Time 8   Period Weeks   Status New     PT LONG TERM GOAL #2   Title Patient will stand for 1 hour without icreased pain in order to return to work    Time 8   Period Weeks   Status New     PT LONG TERM GOAL #3   Title Patient will bend to pick up 2o lb box without pain in order to returnt ot work.    Time 8    Period Weeks   Status New               Plan - 01/14/16 1323    Clinical Impression Statement Patient given exercises to work on at this time for stability and exercises to work on several weeks down the road for adavanced stability. The patient does not have insurance but is applying for assistance. If that is the case he will come back in. If not he will adance the exercises on his own. he required mod cuingfor techanue with standing stretches., He was strongly advised not to bounce and not to push to hard.    Rehab Potential Excellent   PT Frequency 2x / week   PT Duration 8 weeks   PT Treatment/Interventions ADLs/Self Care Home Management;Electrical Stimulation;Iontophoresis 45m/ml Dexamethasone;Cryotherapy;Functional mobility training;Stair training;Gait training;Ultrasound;Moist Heat;Therapeutic activities;Therapeutic exercise;Neuromuscular re-education;Cognitive remediation;Patient/family education;Passive range of motion;Manual techniques;Dry needling;Splinting;Taping   PT Next Visit Plan continue to progress weught bearing as tolerated. Add squats, leg press, baps board, seated or standing rocker borad, work on gait without the crutch.,    PT Home Exercise Plan see instructions    Consulted and Agree with Plan of Care Patient      Patient will benefit from skilled therapeutic intervention in order to improve the following deficits and impairments:  Decreased range of motion, Difficulty walking, Decreased mobility, Decreased strength, Decreased activity tolerance, Pain, Decreased endurance, Abnormal gait  Visit Diagnosis: Difficulty in walking, not elsewhere classified  Muscle weakness (generalized)  Pain in right knee  Pain in right ankle and joints of right foot    PHYSICAL THERAPY DISCHARGE SUMMARY  Visits from Start of Care: 2  Current functional level related to goals / functional outcomes: Patient did not have insurance  Remaining deficits: Unknown     Education / Equipment: Unknown  Plan: Patient agrees to discharge.  Patient goals were not met. Patient is being discharged due to meeting the stated rehab goals.  ?????      Problem List Patient Active Problem List   Diagnosis  Date Noted  . Tibia fracture 10/26/2015  . Comminuted fracture of shaft of tibia 10/25/2015    Carney Living PT DPT  01/14/2016, 1:31 PM  [facility] [address] [place], [place], [address] Phone: [phone]   Fax:  [phone]  Name: Jewel Venditto MRN: [id number] Date of Birth: 03/26/1986

## 2016-06-16 ENCOUNTER — Encounter (HOSPITAL_COMMUNITY): Payer: Self-pay

## 2016-06-16 ENCOUNTER — Emergency Department (HOSPITAL_COMMUNITY)
Admission: EM | Admit: 2016-06-16 | Discharge: 2016-06-17 | Disposition: A | Discharge status: Home / Self Care | Payer: Self-pay | Attending: Emergency Medicine | Admitting: Emergency Medicine

## 2016-06-16 DIAGNOSIS — R350 Frequency of micturition: Secondary | ICD-10-CM | POA: Insufficient documentation

## 2016-06-16 DIAGNOSIS — F17290 Nicotine dependence, other tobacco product, uncomplicated: Secondary | ICD-10-CM | POA: Insufficient documentation

## 2016-06-16 LAB — URINALYSIS, ROUTINE W REFLEX MICROSCOPIC
BILIRUBIN URINE: NEGATIVE
Glucose, UA: NEGATIVE mg/dL
Hgb urine dipstick: NEGATIVE
KETONES UR: NEGATIVE mg/dL
Leukocytes, UA: NEGATIVE
NITRITE: NEGATIVE
PROTEIN: NEGATIVE mg/dL
SPECIFIC GRAVITY, URINE: 1.02 (ref 1.005–1.030)
pH: 7 (ref 5.0–8.0)

## 2016-06-16 NOTE — ED Provider Notes (Signed)
[facility]-EMER[facility]Y DEPT Provider Note   CSN: [id number] Arrival date & time: 06/16/16  2128  By signing my name below, I, Nelwyn SalisburyJoshua Fowler, attest that this documentation has been prepared under the direction and in the presence of non-physician practitioner, Kerrie BuffaloHope Neese, NP. Electronically Signed: Nelwyn SalisburyJoshua Fowler, Scribe. 06/16/2016. 11:54 PM.  History   Chief Complaint Chief Complaint  Patient presents with  . Urinary Frequency  . Dysuria    The history is provided by the patient. No language interpreter was used.  Urinary Frequency  This is a new problem. The current episode started more than 1 week ago. The problem occurs constantly. The problem has not changed since onset.Pertinent negatives include no abdominal pain. Nothing aggravates the symptoms. Nothing relieves the symptoms. He has tried nothing for the symptoms. The treatment provided no relief.    HPI Comments:  Jamie Lowe is a 31 y.o. male with pmhx of STD who presents to the Emergency Department complaining of constant, unchanged urinary frequency for two weeks. Pt was seen by the health department recently where he states he had an STD check and blood drawn for HIV and all the other STD's and tested negative. He denies any penile discharge, scrotal swelling, penile pain, or fever. Pt has had one sexual partner over the past 6 months. Patient states there is no reason he should have an STD but request testing for GC and Chlamydia.  Past Medical History:  Diagnosis Date  . STD (male)     Patient Active Problem List   Diagnosis Date Noted  . Tibia fracture 10/26/2015  . Comminuted fracture of shaft of tibia 10/25/2015    Past Surgical History:  Procedure Laterality Date  . knee sugery (left)     Pt states "I think it was a ligament tear"  . TIBIA IM NAIL INSERTION Right 10/26/2015   Procedure: INTRAMEDULLARY (IM) NAIL TIBIAL;  Surgeon: Dannielle HuhSteve Lucey, MD;  Location[facility];  Service: Orthopedics;  Laterality: Right;      Home Medications    Prior to Admission medications   Medication Sig Start Date End Date Taking? Authorizing Provider  phenazopyridine (PYRIDIUM) 200 MG tablet Take 1 tablet (200 mg total) by mouth 3 (three) times daily. 06/17/16   Hope Orlene OchM Neese, NP    Family History History reviewed. No pertinent family history.  Social History Social History  Substance Use Topics  . Smoking status: Current Every Day Smoker    Types: Cigars  . Smokeless tobacco: Never Used  . Alcohol use Yes     Allergies   Patient has no known allergies.   Review of Systems Review of Systems  Constitutional: Negative for fever.  Gastrointestinal: Negative for abdominal pain, nausea and vomiting.  Genitourinary: Positive for frequency. Negative for decreased urine volume, discharge, dysuria, flank pain, genital sores, hematuria, penile pain, scrotal swelling, testicular pain and urgency.  Musculoskeletal: Negative for back pain.  Skin: Negative for rash.  All other systems reviewed and are negative.    Physical Exam Updated Vital Signs BP 118/68   Pulse 76   Temp 98.2 F (36.8 C) (Oral)   Resp 16   SpO2 100%   Physical Exam  Constitutional: He is oriented to person, place, and time. He appears well-developed and well-nourished. No distress.  HENT:  Head: Normocephalic.  Mouth/Throat: Mucous membranes are normal.  Eyes: Conjunctivae and EOM are normal. Pupils are equal, round, and reactive to light.  Neck: Normal range of motion. Neck supple.  Cardiovascular: Normal rate and regular  rhythm.   Pulmonary/Chest: Effort normal and breath sounds normal.  Abdominal: Soft. Bowel sounds are normal. There is no tenderness.  No CVA tenderness.   Genitourinary: Circumcised.  Musculoskeletal: Normal range of motion.  Lymphadenopathy: No inguinal adenopathy noted on the right or left side.  Neurological: He is alert and oriented to person, place, and time.  Skin: Skin is warm and dry.  Psychiatric:  He has a normal mood and affect. His behavior is normal.  Nursing note and vitals reviewed.    ED Treatments / Results  DIAGNOSTIC STUDIES:  Oxygen Saturation is 100% on RA, normal by my interpretation.    COORDINATION OF CARE:  11:58 PM Discussed treatment plan with pt at bedside which includes urinalysis STD check and pt agreed to plan.  Labs (all labs ordered are listed, but only abnormal results are displayed) Labs Reviewed  URINALYSIS, ROUTINE W REFLEX MICROSCOPIC  GC/CHLAMYDIA PROBE AMP ([place]) NOT AT [facility]    Radiology No results found.  Procedures Procedures (including critical care time)  Medications Ordered in ED Medications - No data to display   Initial Impression / Assessment and Plan / ED Course  I have reviewed the triage vital signs and the nursing notes.    Final Clinical Impressions(s) / ED Diagnoses  30 y.o. male with urinary frequency and request for STD testing stable for d/c without ureteral discharge and normal urine. Discussed with the patient lab results and referral to urology if symptoms persist. Will treat with Pyridium for possible bladder spasm. Final diagnoses:  Urinary frequency    New Prescriptions Discharge Medication List as of 06/17/2016 12:11 AM    START taking these medications   Details  phenazopyridine (PYRIDIUM) 200 MG tablet Take 1 tablet (200 mg total) by mouth 3 (three) times daily., Starting Thu 06/17/2016, Print      I personally performed the services described in this documentation, which was scribed in my presence. The recorded information has been reviewed and is accurate.     [facility] Orlene Och, NP 06/17/16 0120    Layla Maw Ward, DO 06/17/16 [phone]

## 2016-06-16 NOTE — ED Triage Notes (Signed)
Pt complaining of frequent urination. Pt states seen by health dept recently, negative to STD's. Pt states some penile discharge. Pt states ongoing x 2 weeks.

## 2016-06-16 NOTE — ED Notes (Signed)
Rounded on lobby.  Offered warm blankets and updates.   

## 2016-06-17 LAB — GC/CHLAMYDIA PROBE AMP (~~LOC~~) NOT AT ARMC
CHLAMYDIA, DNA PROBE: NEGATIVE
NEISSERIA GONORRHEA: NEGATIVE

## 2016-06-17 MED ORDER — PHENAZOPYRIDINE HCL 200 MG PO TABS: 200.0000 mg | ORAL_TABLET | Freq: Three times a day (TID) | ORAL | 0 refills | Status: DC

## 2016-11-29 ENCOUNTER — Emergency Department (HOSPITAL_COMMUNITY): Payer: Self-pay

## 2016-11-29 ENCOUNTER — Encounter (HOSPITAL_COMMUNITY): Payer: Self-pay | Admitting: Adult Health

## 2016-11-29 ENCOUNTER — Emergency Department (HOSPITAL_COMMUNITY)
Admission: EM | Admit: 2016-11-29 | Discharge: 2016-11-29 | Disposition: A | Discharge status: Home / Self Care | Payer: Self-pay | Attending: Emergency Medicine | Admitting: Emergency Medicine

## 2016-11-29 DIAGNOSIS — F17210 Nicotine dependence, cigarettes, uncomplicated: Secondary | ICD-10-CM | POA: Insufficient documentation

## 2016-11-29 DIAGNOSIS — R0789 Other chest pain: Secondary | ICD-10-CM | POA: Insufficient documentation

## 2016-11-29 LAB — BASIC METABOLIC PANEL
ANION GAP: 9 (ref 5–15)
BUN: 9 mg/dL (ref 6–20)
CALCIUM: 9.2 mg/dL (ref 8.9–10.3)
CO2: 22 mmol/L (ref 22–32)
CREATININE: 1.15 mg/dL (ref 0.61–1.24)
Chloride: 107 mmol/L (ref 101–111)
Glucose, Bld: 118 mg/dL — ABNORMAL HIGH (ref 65–99)
Potassium: 3.9 mmol/L (ref 3.5–5.1)
SODIUM: 138 mmol/L (ref 135–145)

## 2016-11-29 LAB — CBC
HCT: 39.8 % (ref 39.0–52.0)
HEMOGLOBIN: 13.7 g/dL (ref 13.0–17.0)
MCH: 32.5 pg (ref 26.0–34.0)
MCHC: 34.4 g/dL (ref 30.0–36.0)
MCV: 94.3 fL (ref 78.0–100.0)
PLATELETS: 262 10*3/uL (ref 150–400)
RBC: 4.22 MIL/uL (ref 4.22–5.81)
RDW: 13.7 % (ref 11.5–15.5)
WBC: 9.1 10*3/uL (ref 4.0–10.5)

## 2016-11-29 LAB — I-STAT TROPONIN, ED: TROPONIN I, POC: 0 ng/mL (ref 0.00–0.08)

## 2016-11-29 MED ORDER — IBUPROFEN 400 MG PO TABS
400.0000 mg | ORAL_TABLET | Freq: Once | ORAL | Status: AC
  Administered 2016-11-29: 400 mg via ORAL
  Filled 2016-11-29: qty 1

## 2016-11-29 NOTE — Discharge Instructions (Signed)
Take Tylenol or Advil as directed for pain. Call any of the numbers on these instructions to get a primary care doctor and to be seen if not feeling better in a week. Ask your new primary care doctor to help you to stop smoking

## 2016-11-29 NOTE — ED Triage Notes (Signed)
Presents with lefty sided chest pain that began a week ago and is getting worse described as sharp and worse with deep breaths and movement. Denies dizziness and SOB, Denies nausea. Pain never fully goes away. Nothing makes it better.

## 2016-11-29 NOTE — ED Provider Notes (Signed)
[facility] Provider Note   CSN: [id number] Arrival date & time: 11/29/16  1522  By signing my name below, I, Ny'Kea Lewis, attest that this documentation has been prepared under the direction and in the presence of Doug Sou, MD. Electronically Signed: Karren Cobble, ED Scribe. 11/29/16. 7:41 PM.  History   Chief Complaint Chief Complaint  Patient presents with  . Chest Pain   The history is provided by the patient. No language interpreter was used.    HPI Comments: Jamie Lowe is a 31 y.o. male with no pertinent history, who presents to the Emergency Department complaining of gradually worsening, waxing and waning, sharp left sided chest pain At left lateral chest, that began one week ago. He notes associated pain with deep breathing and positional movements. He states "it feels like I pulled something". No alleviating factors noted. No shortness of breath no cough no fever no other associated symptoms. No treatment prior to coming here No treatment tried PTA. Endorses occasional alcohol and tobacco usage. Denies illicit drug usage. Denies shortness of breath. No other associated symptoms noted at this time.  Past Medical History:  Diagnosis Date  . STD (male)    Patient Active Problem List   Diagnosis Date Noted  . Tibia fracture 10/26/2015  . Comminuted fracture of shaft of tibia 10/25/2015   Past Surgical History:  Procedure Laterality Date  . knee sugery (left)     Pt states "I think it was a ligament tear"  . TIBIA IM NAIL INSERTION Right 10/26/2015   Procedure: INTRAMEDULLARY (IM) NAIL TIBIAL;  Surgeon: Dannielle Huh, MD;  [facility] OR;  Service: Orthopedics;  Laterality: Right;    Home Medications    Prior to Admission medications   Not on File   Family History History reviewed. No pertinent family history.  Social History Social History  Substance Use Topics  . Smoking status: Current Every Day Smoker    Types: Cigars  . Smokeless tobacco:  Never Used  . Alcohol use Yes   No illicit drug use Allergies   Patient has no known allergies.  Review of Systems Review of Systems  Constitutional: Negative.   HENT: Negative.   Respiratory: Negative.   Cardiovascular: Positive for chest pain.  Gastrointestinal: Negative.   Musculoskeletal: Negative.   Skin: Negative.   Neurological: Negative.   Psychiatric/Behavioral: Negative.   All other systems reviewed and are negative.   Physical Exam Updated Vital Signs BP 110/82   Pulse 66   Temp 97.9 F (36.6 C) (Oral)   Resp 18   Ht 5\' 9"  (1.753 m)   Wt 151 lb (68.5 kg)   SpO2 100%   BMI 22.30 kg/m   Physical Exam  Constitutional: He appears well-developed and well-nourished. No distress.  HENT:  Head: Normocephalic and atraumatic.  Eyes: Pupils are equal, round, and reactive to light. Conjunctivae are normal.  Neck: Neck supple. No tracheal deviation present. No thyromegaly present.  Cardiovascular: Normal rate, regular rhythm and normal heart sounds.   No murmur heard. Pulmonary/Chest: Effort normal and breath sounds normal. He exhibits tenderness.  Chest is tender at left lateral ribs. Pain is easily reproducible by forcible abduction of left shoulder  Abdominal: Soft. Bowel sounds are normal. He exhibits no distension. There is no tenderness.  Musculoskeletal: Normal range of motion. He exhibits no edema or tenderness.  Neurological: He is alert. Coordination normal.  Skin: Skin is warm and dry. No rash noted.  Psychiatric: He has a normal mood and affect.  Nursing note and vitals reviewed.    ED Treatments / Results  DIAGNOSTIC STUDIES: Oxygen Saturation is 100% on RA, normal by my interpretation.   COORDINATION OF CARE: 7:40 PM-Discussed next steps with pt. Pt verbalized understanding and is agreeable with the plan.   Labs (all labs ordered are listed, but only abnormal results are displayed)  Labs Reviewed  BASIC METABOLIC PANEL - Abnormal; Notable  for the following:       Result Value   Glucose, Bld 118 (*)    All other components within normal limits  CBC  I-STAT TROPOININ, ED    EKG  EKG Interpretation  Date/Time:  Monday November 29 2016 16:04:11 EDT Ventricular Rate:  74 PR Interval:  146 QRS Duration: 82 QT Interval:  400 QTC Calculation: 444 R Axis:   71 Text Interpretation:  Normal sinus rhythm Left ventricular hypertrophy Abnormal ECG No significant change since last tracing Confirmed by Doug Sou [phone]) on 11/29/2016 7:34:42 PM     Chest x-ray viewed by me  Radiology Dg Chest 2 View  Result Date: 11/29/2016 CLINICAL DATA:  Chest pain. EXAM: CHEST  2 VIEW COMPARISON:  03/25/2015 FINDINGS: The heart size and mediastinal contours are within normal limits. Both lungs are clear. The visualized skeletal structures are unremarkable. IMPRESSION: Normal chest. Electronically Signed   By: Francene Boyers M.D.   On: 11/29/2016 17:00   Procedures Procedures (including critical care time)  Medications Ordered in ED Medications - No data to display Results for orders placed or performed during the hospital encounter of 11/29/16  Basic metabolic panel  Result Value Ref Range   Sodium 138 135 - 145 mmol/L   Potassium 3.9 3.5 - 5.1 mmol/L   Chloride 107 101 - 111 mmol/L   CO2 22 22 - 32 mmol/L   Glucose, Bld 118 (H) 65 - 99 mg/dL   BUN 9 6 - 20 mg/dL   Creatinine, Ser 0.98 0.61 - 1.24 mg/dL   Calcium 9.2 8.9 - 11.9 mg/dL   GFR calc non Af Amer >60 >60 mL/min   GFR calc Af Amer >60 >60 mL/min   Anion gap 9 5 - 15  CBC  Result Value Ref Range   WBC 9.1 4.0 - 10.5 K/uL   RBC 4.22 4.22 - 5.81 MIL/uL   Hemoglobin 13.7 13.0 - 17.0 g/dL   HCT 14.7 82.9 - 56.2 %   MCV 94.3 78.0 - 100.0 fL   MCH 32.5 26.0 - 34.0 pg   MCHC 34.4 30.0 - 36.0 g/dL   RDW 13.0 86.5 - 78.4 %   Platelets 262 150 - 400 K/uL  I-stat troponin, ED  Result Value Ref Range   Troponin i, poc 0.00 0.00 - 0.08 ng/mL   Comment 3           Dg Chest  2 View  Result Date: 11/29/2016 CLINICAL DATA:  Chest pain. EXAM: CHEST  2 VIEW COMPARISON:  03/25/2015 FINDINGS: The heart size and mediastinal contours are within normal limits. Both lungs are clear. The visualized skeletal structures are unremarkable. IMPRESSION: Normal chest. Electronically Signed   By: Francene Boyers M.D.   On: 11/29/2016 17:00    Initial Impression / Assessment and Plan / ED Course  I have reviewed the triage vital signs and the nursing notes.  Pertinent labs & imaging results that were available during my care of the patient were reviewed by me and considered in my medical decision making (see chart for details).  Plan Tylenol or Advil for pain. He'll receive ibuprofen 400 mg by mouth prior to discharge I counseled patient for 5 minutes on smoking cessation. Referral primary care  Final Clinical Impressions(s) / ED Diagnoses  Diagnosis #1 chest wall pain #2 tobacco abuse Final diagnoses:  None   New Prescriptions New Prescriptions   No medications on file  I personally performed the services described in this documentation, which was scribed in my presence. The recorded information has been reviewed and considered.     Doug SouJacubowitz, Tiyah Zelenak, MD 11/29/16 [phone]

## 2016-12-15 ENCOUNTER — Emergency Department (HOSPITAL_COMMUNITY)
Admission: EM | Admit: 2016-12-15 | Discharge: 2016-12-15 | Disposition: A | Discharge status: Home / Self Care | Payer: Self-pay | Attending: Emergency Medicine | Admitting: Emergency Medicine

## 2016-12-15 ENCOUNTER — Encounter (HOSPITAL_COMMUNITY): Payer: Self-pay | Admitting: Emergency Medicine

## 2016-12-15 DIAGNOSIS — J029 Acute pharyngitis, unspecified: Secondary | ICD-10-CM | POA: Insufficient documentation

## 2016-12-15 DIAGNOSIS — F17290 Nicotine dependence, other tobacco product, uncomplicated: Secondary | ICD-10-CM | POA: Insufficient documentation

## 2016-12-15 LAB — RAPID STREP SCREEN (MED CTR MEBANE ONLY): Streptococcus, Group A Screen (Direct): NEGATIVE

## 2016-12-15 MED ORDER — DEXAMETHASONE 4 MG PO TABS
10.0000 mg | ORAL_TABLET | Freq: Once | ORAL | Status: AC
  Administered 2016-12-15: 10 mg via ORAL
  Filled 2016-12-15: qty 3

## 2016-12-15 NOTE — ED Provider Notes (Signed)
[facility] Provider Note   CSN: [id number] Arrival date & time: 12/15/16  0028     History   Chief Complaint Chief Complaint  Patient presents with  . Sore Throat    HPI Jamie Lowe is a 31 y.o. male.  Patient presents for evaluation of sore throat for the past 4 days. No fever. He reports difficulty swallowing due to pain. No significant congestion, headache, nausea. No sick contacts.    The history is provided by the patient. No language interpreter was used.  Sore Throat  Pertinent negatives include no chest pain, no headaches and no shortness of breath.    Past Medical History:  Diagnosis Date  . STD (male)     Patient Active Problem List   Diagnosis Date Noted  . Tibia fracture 10/26/2015  . Comminuted fracture of shaft of tibia 10/25/2015    Past Surgical History:  Procedure Laterality Date  . knee sugery (left)     Pt states "I think it was a ligament tear"  . TIBIA IM NAIL INSERTION Right 10/26/2015   Procedure: INTRAMEDULLARY (IM) NAIL TIBIAL;  Surgeon: Dannielle HuhSteve Lucey, MD;  Location: [facility] OR;  Service: Orthopedics;  Laterality: Right;       Home Medications    Prior to Admission medications   Not on File    Family History No family history on file.  Social History Social History  Substance Use Topics  . Smoking status: Current Every Day Smoker    Types: Cigars  . Smokeless tobacco: Never Used  . Alcohol use No     Comment: socially     Allergies   Patient has no known allergies.   Review of Systems Review of Systems  Constitutional: Negative for fever.  HENT: Positive for sore throat. Negative for congestion and trouble swallowing.   Respiratory: Negative for shortness of breath.   Cardiovascular: Negative for chest pain.  Gastrointestinal: Negative for nausea.  Skin: Negative for rash.  Neurological: Negative for headaches.     Physical Exam Updated Vital Signs BP (!) 120/92 (BP Location: Right Arm)   Pulse 100    Temp 98.5 F (36.9 C) (Oral)   Resp 20   Ht 5\' 8"  (1.727 m)   Wt 70.3 kg (155 lb)   SpO2 100%   BMI 23.57 kg/m   Physical Exam  Constitutional: He appears well-developed and well-nourished.  HENT:  Head: Normocephalic.  Oropharynx moderately swelling generally. No PTA visualized. Uvula midline. Slightly erythematous. No exudates.  Neck: Normal range of motion. Neck supple.  Cardiovascular: Normal rate and regular rhythm.   Pulmonary/Chest: Effort normal and breath sounds normal. He has no wheezes. He has no rales.  Abdominal: Soft. Bowel sounds are normal. There is no tenderness. There is no rebound and no guarding.  Musculoskeletal: Normal range of motion.  Lymphadenopathy:    He has cervical adenopathy.  Neurological: He is alert.  Skin: Skin is warm and dry. No rash noted.  Psychiatric: He has a normal mood and affect.     ED Treatments / Results  Labs (all labs ordered are listed, but only abnormal results are displayed) Labs Reviewed  RAPID STREP SCREEN (NOT AT [facility])  CULTURE, GROUP A STREP [facility]   Results for orders placed or performed during the hospital encounter of 12/15/16  Rapid strep screen  Result Value Ref Range   Streptococcus, Group A Screen (Direct) NEGATIVE NEGATIVE    EKG  EKG Interpretation None       Radiology No  results found.  Procedures Procedures (including critical care time)  Medications Ordered in ED Medications - No data to display   Initial Impression / Assessment and Plan / ED Course  I have reviewed the triage vital signs and the nursing notes.  Pertinent labs & imaging results that were available during my care of the patient were reviewed by me and considered in my medical decision making (see chart for details).     Patient presents with isolated sore throat. Exam supports viral etiology. Will give decadron for symptomatic relief.   Final Clinical Impressions(s) / ED Diagnoses   Final diagnoses:  None   1.  Pharyngitis   New Prescriptions New Prescriptions   No medications on file     Elpidio AnisUpstill, Teddie Mehta, Cordelia Poche-C 12/15/16 0258    Shon BatonHorton, Courtney F, MD 12/15/16 [phone]

## 2016-12-15 NOTE — ED Triage Notes (Signed)
Pt reports sore throat X1 day with subjective fevers/chills.

## 2016-12-17 LAB — CULTURE, GROUP A STREP (THRC)

## 2017-04-18 ENCOUNTER — Other Ambulatory Visit: Payer: Self-pay

## 2017-04-18 ENCOUNTER — Emergency Department (HOSPITAL_COMMUNITY)
Admission: EM | Admit: 2017-04-18 | Discharge: 2017-04-18 | Disposition: A | Discharge status: Home / Self Care | Payer: MEDICAID

## 2017-05-07 ENCOUNTER — Other Ambulatory Visit: Payer: Self-pay

## 2017-05-07 ENCOUNTER — Encounter (HOSPITAL_COMMUNITY): Payer: Self-pay

## 2017-05-07 ENCOUNTER — Emergency Department (HOSPITAL_COMMUNITY)
Admission: EM | Admit: 2017-05-07 | Discharge: 2017-05-07 | Disposition: A | Discharge status: Home / Self Care | Payer: Self-pay | Attending: Emergency Medicine | Admitting: Emergency Medicine

## 2017-05-07 DIAGNOSIS — R300 Dysuria: Secondary | ICD-10-CM | POA: Insufficient documentation

## 2017-05-07 DIAGNOSIS — F17210 Nicotine dependence, cigarettes, uncomplicated: Secondary | ICD-10-CM | POA: Insufficient documentation

## 2017-05-07 DIAGNOSIS — Z202 Contact with and (suspected) exposure to infections with a predominantly sexual mode of transmission: Secondary | ICD-10-CM | POA: Insufficient documentation

## 2017-05-07 DIAGNOSIS — R360 Urethral discharge without blood: Secondary | ICD-10-CM | POA: Insufficient documentation

## 2017-05-07 LAB — RAPID HIV SCREEN (HIV 1/2 AB+AG)
HIV 1/2 Antibodies: NONREACTIVE
HIV-1 P24 ANTIGEN - HIV24: NONREACTIVE

## 2017-05-07 NOTE — ED Triage Notes (Signed)
Pt states he has noticed some penile discharge. States new sexual partner. Pt wants to be checked for STDs

## 2017-05-07 NOTE — Discharge Instructions (Signed)
Please read and follow all provided instructions.  Your diagnoses today include:  1. Possible exposure to STD     Tests performed today include: Test for gonorrhea and chlamydia. You will be notified by telephone if you have a positive result. Vital signs. See below for your results today.   Home care instructions:  Read educational materials contained in this packet and follow any instructions provided.   You should tell your partners about your infection and avoid having sex for one week to allow time for the medicine to work.  Follow-up instructions: You should [facility] to be tested for HIV, syphilis, and hepatitis -- all of which can be transmitted by sexual contact. We do not routinely screen for these in the Emergency Department.  STD Testing: [facility] [place] [facility] [address], [place], phone [phone] or [phone]   Monday - Friday, call for an appointment [facility] [place] [facility] [address], [place], phone [phone] or [phone]  Monday - Friday, call for an appointment  Return instructions:  Please return to the Emergency Department if you experience worsening symptoms.  Please return if you have any other emergent concerns.  Additional Information:  Your vital signs today were: BP (!) 120/98 (BP Location: Right Arm)    Pulse 85    Temp 98.6 F (37 C) (Oral)    Resp 16    SpO2 100%  If your blood pressure (BP) was elevated above 135/85 this visit, please have this repeated by your doctor within one month. --------------

## 2017-05-07 NOTE — ED Notes (Signed)
Declined W/C at D/C and was escorted to lobby by RN. 

## 2017-05-07 NOTE — ED Provider Notes (Signed)
[facility] EMERGENCY DEPARTMENT Provider Note   CSN: [id number] Arrival date & time: 05/07/17  1711     History   Chief Complaint Chief Complaint  Patient presents with  . SEXUALLY TRANSMITTED DISEASE    HPI Jamie Lowe is a 31 y.o. male.  HPI  31 y.o. male, presents to the Emergency Department today due to penile discharge. Noticed today. New sexual partner as well as others. Notes mild discharge with slight dysuria. No abdominal pain. No fevers. No swelling or erythema. Pt wants to be checked for STDs. Rates pain 1/10. Slight burning.  No meds PTA.    Past Medical History:  Diagnosis Date  . STD (male)     Patient Active Problem List   Diagnosis Date Noted  . Tibia fracture 10/26/2015  . Comminuted fracture of shaft of tibia 10/25/2015    Past Surgical History:  Procedure Laterality Date  . knee sugery (left)     Pt states "I think it was a ligament tear"  . TIBIA IM NAIL INSERTION Right 10/26/2015   Procedure: INTRAMEDULLARY (IM) NAIL TIBIAL;  Surgeon: Dannielle HuhSteve Lucey, MD;  Location: [facility] OR;  Service: Orthopedics;  Laterality: Right;       Home Medications    Prior to Admission medications   Not on File    Family History History reviewed. No pertinent family history.  Social History Social History   Tobacco Use  . Smoking status: Current Every Day Smoker    Types: Cigars  . Smokeless tobacco: Never Used  Substance Use Topics  . Alcohol use: No    Comment: socially  . Drug use: Not on file     Allergies   Patient has no known allergies.   Review of Systems Review of Systems ROS reviewed and all are negative for acute change except as noted in the HPI.  Physical Exam Updated Vital Signs BP (!) 120/98 (BP Location: Right Arm)   Pulse 85   Temp 98.6 F (37 C) (Oral)   Resp 16   SpO2 100%   Physical Exam  Constitutional: He is oriented to person, place, and time. He appears well-developed and well-nourished. No  distress.  HENT:  Head: Normocephalic and atraumatic.  Right Ear: Tympanic membrane, external ear and ear canal normal.  Left Ear: Tympanic membrane, external ear and ear canal normal.  Nose: Nose normal.  Mouth/Throat: Uvula is midline, oropharynx is clear and moist and mucous membranes are normal. No trismus in the jaw. No oropharyngeal exudate, posterior oropharyngeal erythema or tonsillar abscesses.  Eyes: EOM are normal. Pupils are equal, round, and reactive to light.  Neck: Normal range of motion. Neck supple. No tracheal deviation present.  Cardiovascular: Normal rate, regular rhythm, S1 normal, S2 normal, normal heart sounds, intact distal pulses and normal pulses.  Pulmonary/Chest: Effort normal and breath sounds normal. No respiratory distress. He has no decreased breath sounds. He has no wheezes. He has no rhonchi. He has no rales.  Abdominal: Normal appearance and bowel sounds are normal. There is no tenderness.  Genitourinary:  Genitourinary Comments: Chaperone present. Mild discharge from urethra. No erythema. No testicular pain or swelling   Musculoskeletal: Normal range of motion.  Neurological: He is alert and oriented to person, place, and time.  Skin: Skin is warm and dry.  Psychiatric: He has a normal mood and affect. His speech is normal and behavior is normal. Thought content normal.     ED Treatments / Results  Labs (all labs ordered are  listed, but only abnormal results are displayed) Labs Reviewed  RAPID HIV SCREEN (HIV 1/2 AB+AG)  RPR  GC/CHLAMYDIA PROBE AMP ([place]) NOT AT [facility]    EKG  EKG Interpretation None       Radiology No results found.  Procedures Procedures (including critical care time)  Medications Ordered in ED Medications - No data to display   Initial Impression / Assessment and Plan / ED Course  I have reviewed the triage vital signs and the nursing notes.  Pertinent labs & imaging results that were available during my  care of the patient were reviewed by me and considered in my medical decision making (see chart for details).  Final Clinical Impressions(s) / ED Diagnoses  {I have reviewed and evaluated the relevant laboratory values.   {I have reviewed the relevant previous healthcare records.  {I obtained HPI from historian.   ED Course:  Assessment: Pt is a 31 y.o. male presents to the Emergency Department today due to penile discharge. Noticed today. New sexual partner as well as others. Notes mild discharge with slight dysuria. No abdominal pain. No fevers. No swelling or erythema. Pt wants to be checked for STDs. Rates pain 1/10. Slight burning.  No meds PTA. On exam, pt in NAD. Nontoxic/nonseptic appearing. VSS. Afebrile. Lungs CTA. Heart RRR. Abdomen nontender soft. Gu exam with mild penile discharge. [facility]. TestingHIV and RPR as well.Plan is to DC home with follow up to PCP. At time of discharge, Patient is in no acute distress. Vital Signs are stable. Patient is able to ambulate. Patient able to tolerate PO.   Disposition/Plan:  DC Home Additional Verbal discharge instructions given and discussed with patient.  Pt Instructed to f/u with PCP in the next week for evaluation and treatment of symptoms. Return precautions given Pt acknowledges and agrees with plan  Supervising Physician Tegeler, Canary Brimhristopher J, *  Final diagnoses:  Possible exposure to STD    ED Discharge Orders    None       Audry PiliMohr, Mirenda Baltazar, PA-C 05/07/17 1835    Tegeler, Canary Brimhristopher J, MD 05/08/17 0010

## 2017-05-08 LAB — RPR: RPR Ser Ql: NONREACTIVE

## 2017-05-09 LAB — GC/CHLAMYDIA PROBE AMP (~~LOC~~) NOT AT ARMC
Chlamydia: NEGATIVE
NEISSERIA GONORRHEA: NEGATIVE

## 2017-07-03 ENCOUNTER — Encounter (HOSPITAL_COMMUNITY): Payer: Self-pay

## 2017-07-03 ENCOUNTER — Emergency Department (HOSPITAL_COMMUNITY)
Admission: EM | Admit: 2017-07-03 | Discharge: 2017-07-03 | Disposition: A | Discharge status: Home / Self Care | Payer: Self-pay | Attending: Emergency Medicine | Admitting: Emergency Medicine

## 2017-07-03 DIAGNOSIS — F17210 Nicotine dependence, cigarettes, uncomplicated: Secondary | ICD-10-CM | POA: Insufficient documentation

## 2017-07-03 DIAGNOSIS — R300 Dysuria: Secondary | ICD-10-CM | POA: Insufficient documentation

## 2017-07-03 DIAGNOSIS — Z202 Contact with and (suspected) exposure to infections with a predominantly sexual mode of transmission: Secondary | ICD-10-CM | POA: Insufficient documentation

## 2017-07-03 LAB — URINALYSIS, ROUTINE W REFLEX MICROSCOPIC
BACTERIA UA: NONE SEEN
BILIRUBIN URINE: NEGATIVE
Glucose, UA: NEGATIVE mg/dL
HGB URINE DIPSTICK: NEGATIVE
KETONES UR: NEGATIVE mg/dL
NITRITE: NEGATIVE
Protein, ur: NEGATIVE mg/dL
RBC / HPF: NONE SEEN RBC/hpf (ref 0–5)
Specific Gravity, Urine: 1.026 (ref 1.005–1.030)
pH: 5 (ref 5.0–8.0)

## 2017-07-03 MED ORDER — AZITHROMYCIN 250 MG PO TABS
1000.0000 mg | ORAL_TABLET | Freq: Once | ORAL | Status: AC
  Administered 2017-07-03: 1000 mg via ORAL
  Filled 2017-07-03: qty 4

## 2017-07-03 MED ORDER — CEFTRIAXONE SODIUM 250 MG IJ SOLR
250.0000 mg | Freq: Once | INTRAMUSCULAR | Status: AC
  Administered 2017-07-03: 250 mg via INTRAMUSCULAR
  Filled 2017-07-03: qty 250

## 2017-07-03 MED ORDER — LIDOCAINE HCL (PF) 1 % IJ SOLN
INTRAMUSCULAR | Status: AC
  Administered 2017-07-03: 5 mL
  Filled 2017-07-03: qty 5

## 2017-07-03 NOTE — Discharge Instructions (Signed)
Follow up with the health department for further testing. °

## 2017-07-03 NOTE — ED Provider Notes (Signed)
[facility] EMERGENCY DEPARTMENT Provider Note   CSN: [id number] Arrival date & time: 07/03/17  1112     History   Chief Complaint No chief complaint on file.   HPI Seith Aikey is a 32 y.o. male who presents to the ED for STD testing. Patient c/o dysuria. Hx of trichomonas, GC and Chlamydia. Patient had unprotected sex with new partner prior to symptoms starting.   The history is provided by the patient. No language interpreter was used.  Dysuria   This is a new problem. The current episode started more than 2 days ago. The problem occurs every urination. The problem has been gradually worsening. The quality of the pain is described as burning. There has been no fever. He is sexually active. Associated symptoms include discharge, frequency and urgency. Pertinent negatives include no chills, no nausea and no vomiting. He has tried nothing for the symptoms.    Past Medical History:  Diagnosis Date  . STD (male)     Patient Active Problem List   Diagnosis Date Noted  . Tibia fracture 10/26/2015  . Comminuted fracture of shaft of tibia 10/25/2015    Past Surgical History:  Procedure Laterality Date  . knee sugery (left)     Pt states "I think it was a ligament tear"  . TIBIA IM NAIL INSERTION Right 10/26/2015   Procedure: INTRAMEDULLARY (IM) NAIL TIBIAL;  Surgeon: Dannielle Huh, MD;  Location: [facility] OR;  Service: Orthopedics;  Laterality: Right;       Home Medications    Prior to Admission medications   Not on File    Family History No family history on file.  Social History Social History   Tobacco Use  . Smoking status: Current Every Day Smoker    Types: Cigars  . Smokeless tobacco: Never Used  Substance Use Topics  . Alcohol use: No    Comment: socially  . Drug use: Not on file     Allergies   Patient has no known allergies.   Review of Systems Review of Systems  Constitutional: Negative for chills and fever.  HENT: Negative.     Eyes: Negative for visual disturbance.  Respiratory: Negative for cough and shortness of breath.   Cardiovascular: Negative for chest pain.  Gastrointestinal: Negative for abdominal pain, nausea and vomiting.  Genitourinary: Positive for discharge, dysuria, frequency and urgency.  Musculoskeletal: Negative for back pain.  Skin: Negative for rash.  Neurological: Negative for light-headedness and headaches.  Psychiatric/Behavioral: Negative for confusion.     Physical Exam Updated Vital Signs BP 107/81 (BP Location: Right Arm)   Pulse 81   Temp 98.2 F (36.8 C)   Ht 5\' 2"  (1.575 m)   Wt 72.6 kg (160 lb)   SpO2 100%   BMI 29.26 kg/m   Physical Exam  Constitutional: He appears well-developed and well-nourished. No distress.  HENT:  Head: Normocephalic.  Eyes: EOM are normal.  Neck: Neck supple.  Cardiovascular: Normal rate.  Pulmonary/Chest: Effort normal.  Abdominal: Soft. Bowel sounds are normal. There is no tenderness.  Genitourinary: Testes normal. Circumcised. No penile erythema or penile tenderness. No discharge found.  Musculoskeletal: Normal range of motion.  Lymphadenopathy: Inguinal adenopathy noted on the right side.  Neurological: He is alert.  Skin: Skin is warm and dry.  Psychiatric: He has a normal mood and affect.  Nursing note and vitals reviewed.    ED Treatments / Results  Labs (all labs ordered are listed, but only abnormal results are displayed)  Labs Reviewed  URINALYSIS, ROUTINE W REFLEX MICROSCOPIC - Abnormal; Notable for the following components:      Result Value   Leukocytes, UA TRACE (*)    Squamous Epithelial / LPF 0-5 (*)    All other components within normal limits  RPR  HIV ANTIBODY (ROUTINE TESTING)  GC/CHLAMYDIA PROBE AMP ([place]) NOT AT [facility]   Radiology No results found.  Procedures Procedures (including critical care time)  Medications Ordered in ED Medications  cefTRIAXone (ROCEPHIN) injection 250 mg (not  administered)  azithromycin (ZITHROMAX) tablet 1,000 mg (not administered)     Initial Impression / Assessment and Plan / ED Course  I have reviewed the triage vital signs and the nursing notes. Pt presents with concerns for possible STD.  Pt understands that they have GC/Chlamydia cultures pending and that they will need to inform all sexual partners if results return positive. Pt has been treated prophylactically with azithromycin and Rocephin due to pts history, exam  Patient to be discharged with instructions to follow up with [facility]. Discussed importance of using protection when sexually active.   Final Clinical Impressions(s) / ED Diagnoses   Final diagnoses:  Dysuria  Possible exposure to STD    ED Discharge Orders    None       Kerrie Buffaloeese, Hope Kirtland AFBM, [place] 07/03/17 1317    Benjiman CorePickering, Nathan, MD 07/03/17 2237

## 2017-07-03 NOTE — ED Notes (Signed)
Declined W/C at D/C and was escorted to lobby by RN. 

## 2017-07-03 NOTE — ED Triage Notes (Signed)
Patient complains of dysuria and wants to be checked for STD, NAD

## 2017-07-04 LAB — HIV ANTIBODY (ROUTINE TESTING W REFLEX): HIV SCREEN 4TH GENERATION: NONREACTIVE

## 2017-07-04 LAB — RPR: RPR Ser Ql: NONREACTIVE

## 2017-07-04 LAB — GC/CHLAMYDIA PROBE AMP (~~LOC~~) NOT AT ARMC
Chlamydia: NEGATIVE
Neisseria Gonorrhea: NEGATIVE

## 2017-08-04 IMAGING — CR DG TIBIA/FIBULA 2V*R*
4 series · 4 of 4 positions shown · non-contrast
Comparison: None.

CLINICAL DATA: R tib/fib deformity playing football X today

EXAM:
RIGHT TIBIA AND FIBULA - 2 VIEW

[tibia ap (1 of 2)]
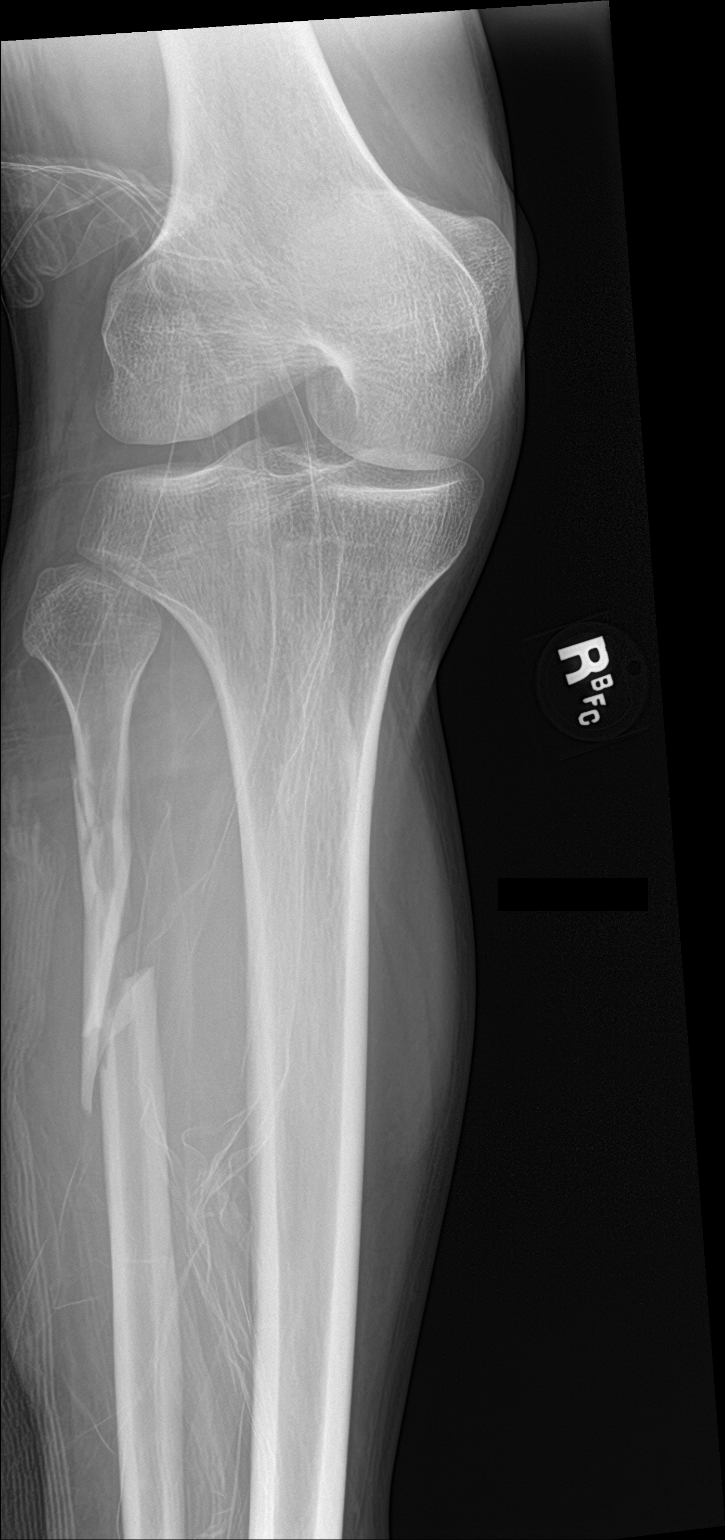

[tibia ap (2 of 2)]
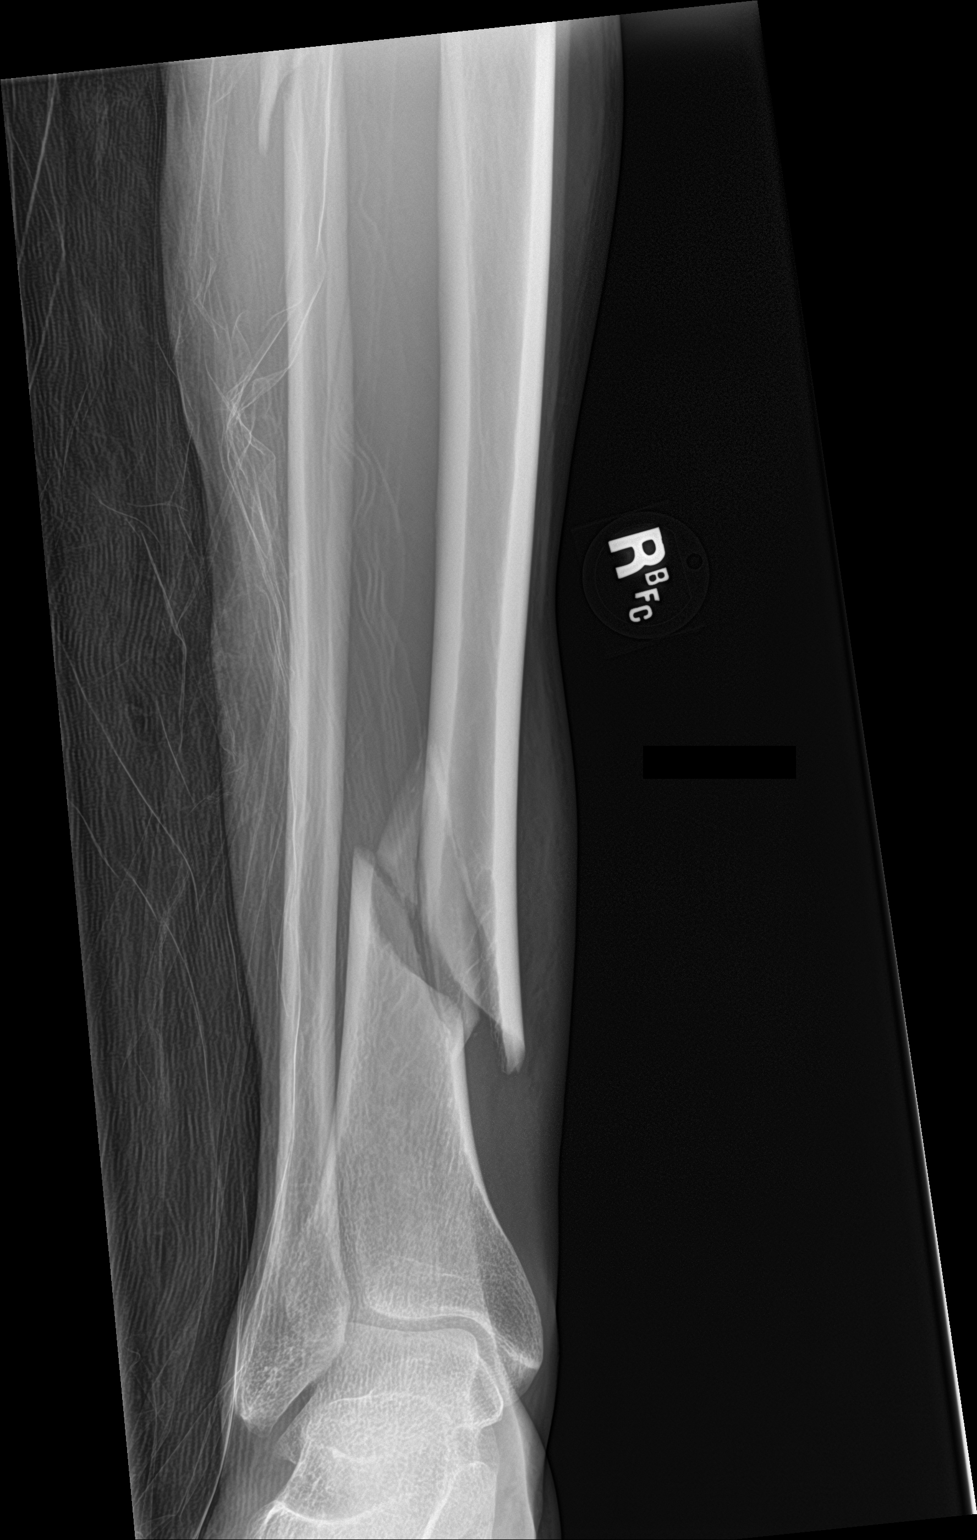

[tibia lat (1 of 2)]
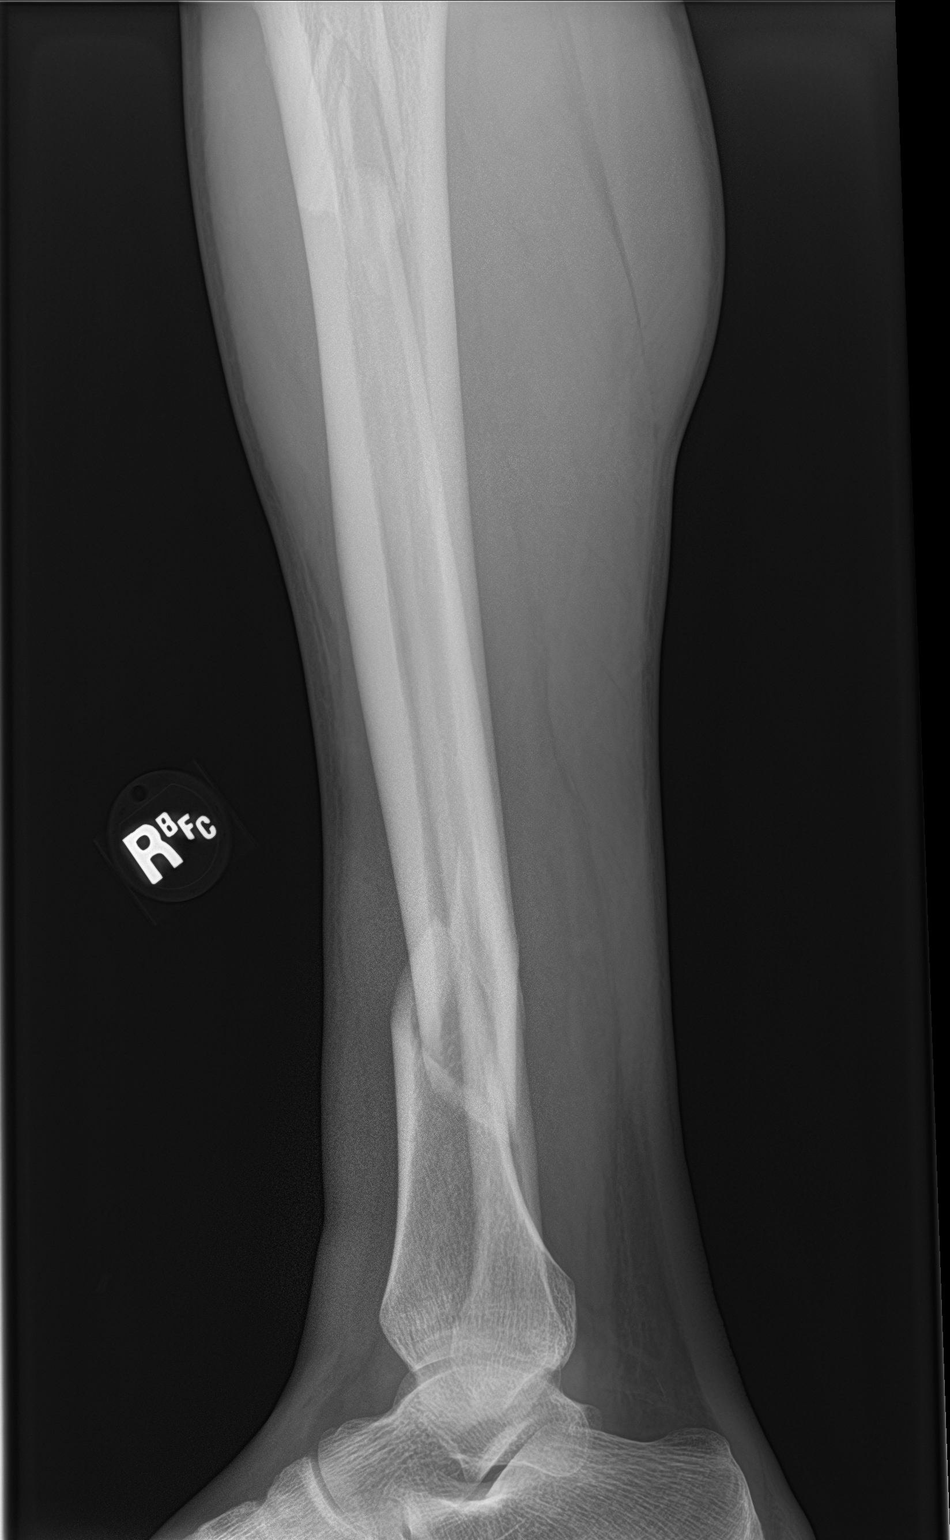

[tibia lat (2 of 2)]
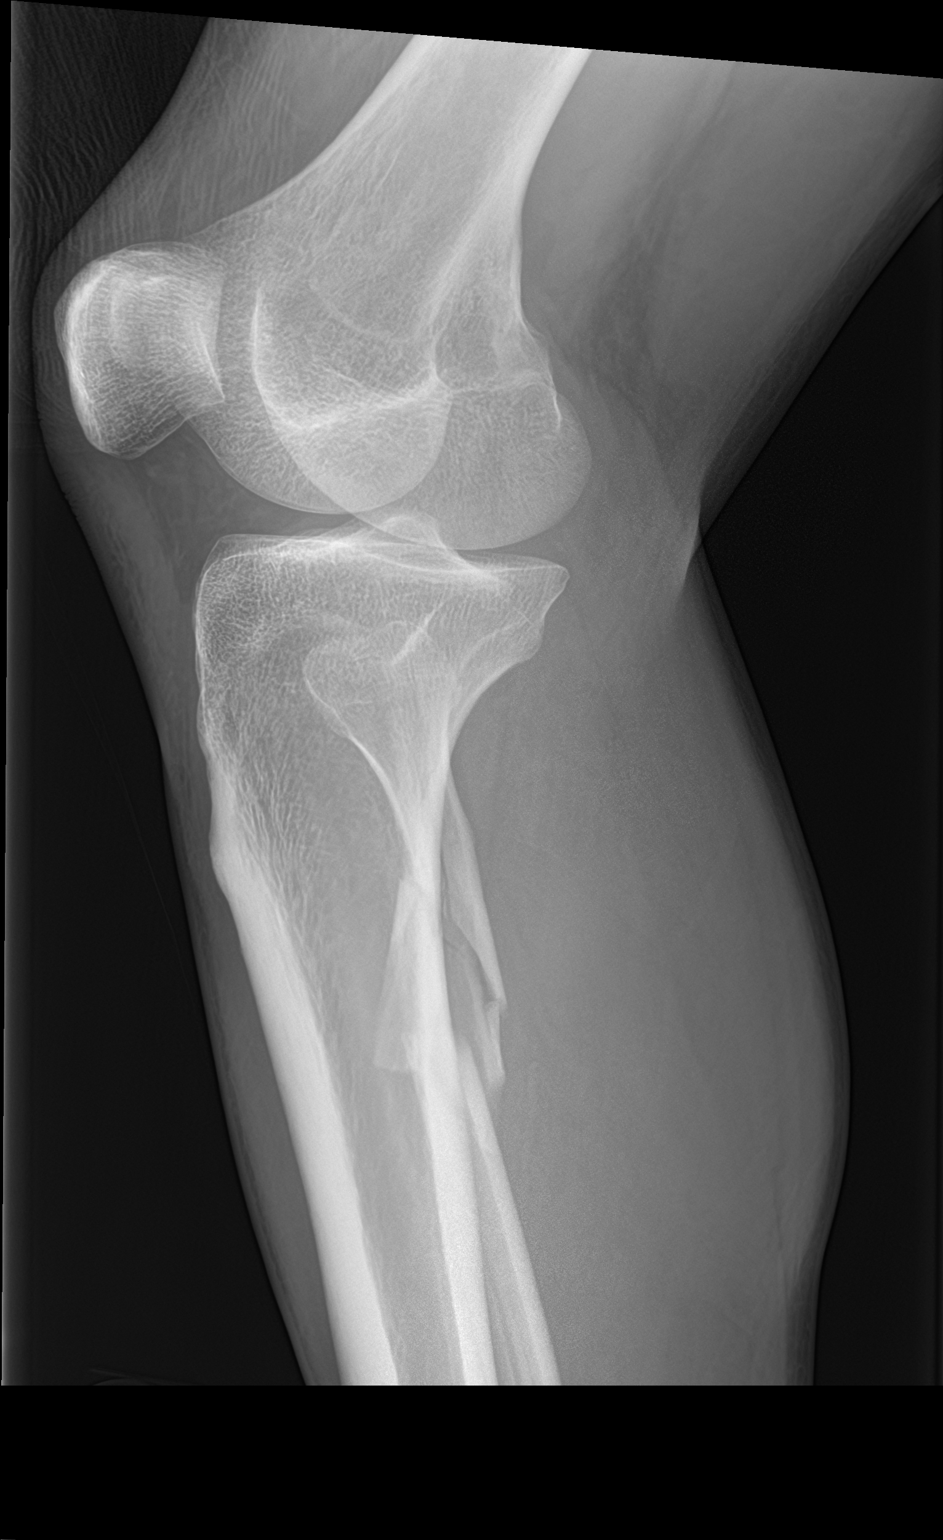

[4 of 4 positions shown; findings below may reference images not displayed]

FINDINGS: Comminuted fracture junction of proximal and middle thirds fibular
shaft with medial displacement of distal fracture fragment.
Comminuted fracture distal shaft of the tibia with lateral
displacement of distal fracture fragment.
IMPRESSION: Comminuted fractures of the fibula and tibia

## 2017-08-05 IMAGING — RF DG C-ARM 61-120 MIN
1 series · 3 of 3 positions shown · non-contrast
Comparison: 10/25/2015

CLINICAL DATA: Open detection internal fixation right distal tibial
fracture

EXAM:
DG C-ARM 61-120 MIN; RIGHT TIBIA AND FIBULA - 2 VIEW

[Series 1: run · 3 of 3 slices shown]
[im 1/3]
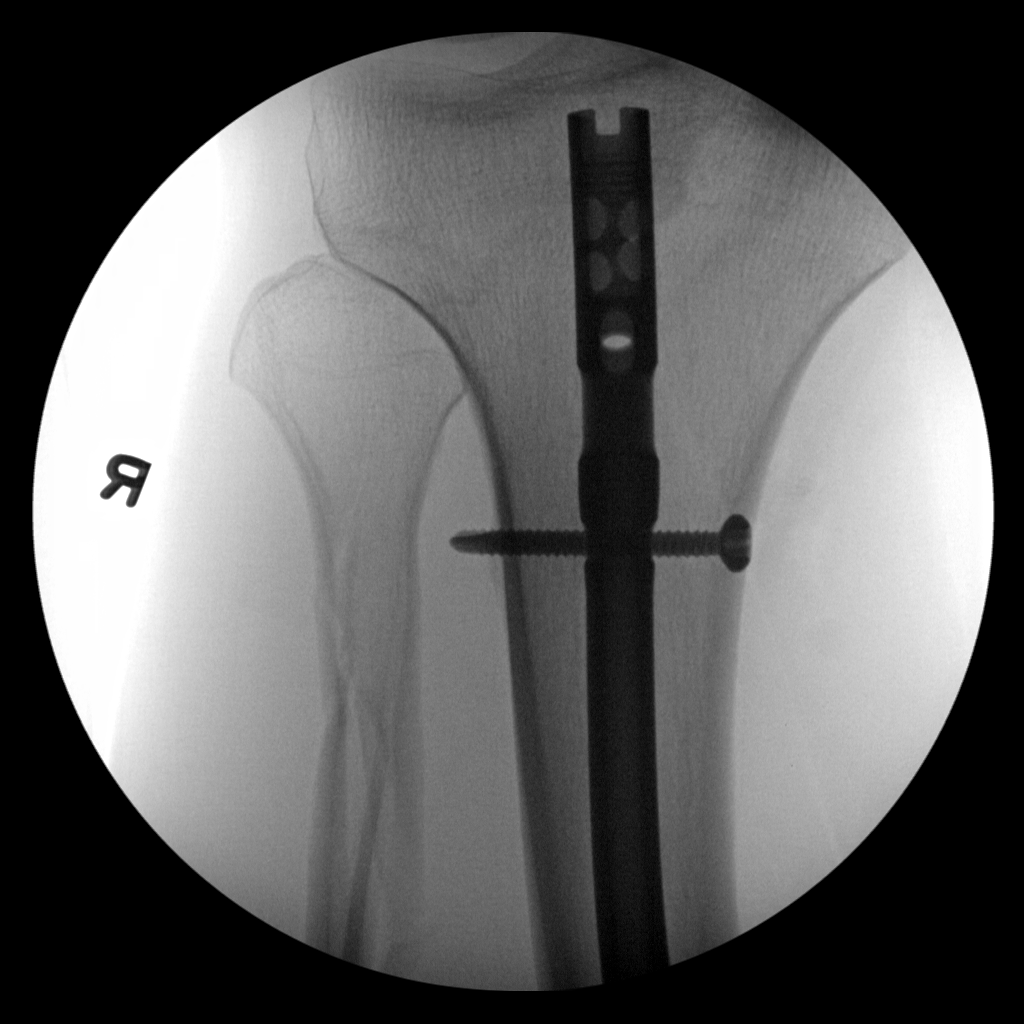
[im 2/3]
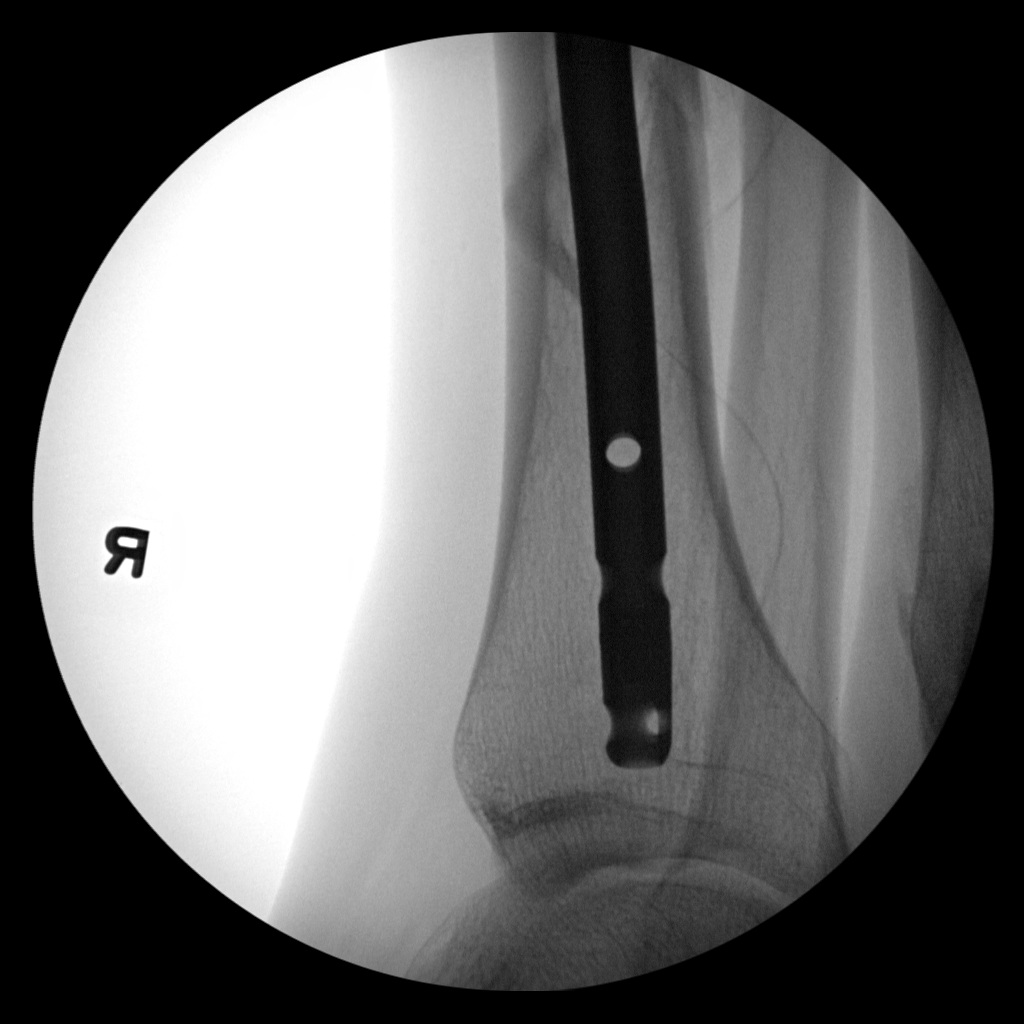
[im 3/3]
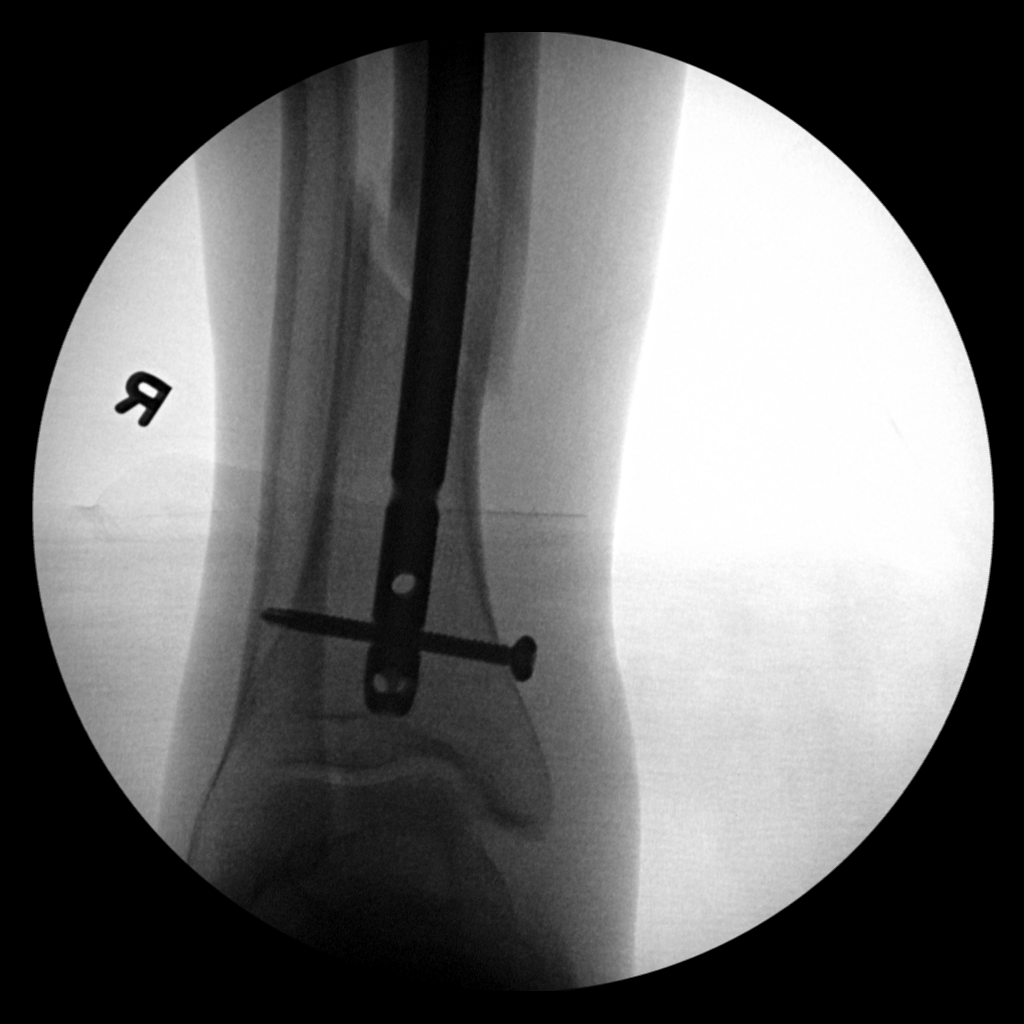

[3 of 3 positions shown; findings below may reference images not displayed]

FINDINGS: The patient is status post open reduction internal fixation of
distal tibial fracture. There is intra medullary rod in right tibia.
There is near anatomic alignment. Again noted mild displaced
fracture proximal shaft of the right fibula.
IMPRESSION: There is intramedullary rod in right tibia. There is near anatomic
alignment of distal tibial fracture. Again noted mild displaced
fracture proximal shaft of the right fibula.

Fluoroscopy time was 1 minute and 6 seconds. Please see the
operative report.

## 2017-10-21 ENCOUNTER — Emergency Department (HOSPITAL_COMMUNITY)
Admission: EM | Admit: 2017-10-21 | Discharge: 2017-10-21 | Disposition: A | Discharge status: Home / Self Care | Payer: Self-pay | Attending: Emergency Medicine | Admitting: Emergency Medicine

## 2017-10-21 ENCOUNTER — Other Ambulatory Visit: Payer: Self-pay

## 2017-10-21 ENCOUNTER — Encounter (HOSPITAL_COMMUNITY): Payer: Self-pay | Admitting: *Deleted

## 2017-10-21 DIAGNOSIS — R309 Painful micturition, unspecified: Secondary | ICD-10-CM | POA: Insufficient documentation

## 2017-10-21 DIAGNOSIS — Z202 Contact with and (suspected) exposure to infections with a predominantly sexual mode of transmission: Secondary | ICD-10-CM | POA: Insufficient documentation

## 2017-10-21 DIAGNOSIS — F17290 Nicotine dependence, other tobacco product, uncomplicated: Secondary | ICD-10-CM | POA: Insufficient documentation

## 2017-10-21 LAB — URINALYSIS, ROUTINE W REFLEX MICROSCOPIC
BILIRUBIN URINE: NEGATIVE
GLUCOSE, UA: NEGATIVE mg/dL
Hgb urine dipstick: NEGATIVE
Ketones, ur: NEGATIVE mg/dL
Leukocytes, UA: NEGATIVE
Nitrite: NEGATIVE
PH: 7 (ref 5.0–8.0)
Protein, ur: NEGATIVE mg/dL
SPECIFIC GRAVITY, URINE: 1.017 (ref 1.005–1.030)

## 2017-10-21 MED ORDER — AZITHROMYCIN 250 MG PO TABS
1000.0000 mg | ORAL_TABLET | Freq: Once | ORAL | Status: AC
  Administered 2017-10-21: 1000 mg via ORAL
  Filled 2017-10-21: qty 4

## 2017-10-21 MED ORDER — LIDOCAINE HCL (PF) 1 % IJ SOLN
INTRAMUSCULAR | Status: AC
  Administered 2017-10-21: 0.9 mL
  Filled 2017-10-21: qty 5

## 2017-10-21 MED ORDER — CEFTRIAXONE SODIUM 250 MG IJ SOLR
250.0000 mg | Freq: Once | INTRAMUSCULAR | Status: AC
  Administered 2017-10-21: 250 mg via INTRAMUSCULAR
  Filled 2017-10-21: qty 250

## 2017-10-21 MED ORDER — METRONIDAZOLE 500 MG PO TABS
2000.0000 mg | ORAL_TABLET | Freq: Once | ORAL | Status: AC
Start: 2017-10-21 — End: 2017-10-21
  Administered 2017-10-21: 2000 mg via ORAL
  Filled 2017-10-21: qty 4

## 2017-10-21 NOTE — ED Provider Notes (Signed)
[facility] Provider Note   CSN: [id number] Arrival date & time: 10/21/17  1546     History   Chief Complaint Chief Complaint  Patient presents with  . Exposure to STD    HPI Jamie Lowe is a 32 y.o. male.  HPI   32 year old male here with concerns STI.  Pt report his sexual partner told him she has STD.  She was diagnosed with trichomonas 2 days ago.  His last sexual activities was 4 days ago.  He did report some mild stinging sensation while urinate but denies increased urinary frequency or urgency and denies any testicular pain, penile rash, or abdominal pain.  No fever or chills.  No penile discharge.  He has been sexually active for several partners within the past 6 months.  He was diagnosed with STI in the past.  Past Medical History:  Diagnosis Date  . STD (male)     Patient Active Problem List   Diagnosis Date Noted  . Tibia fracture 10/26/2015  . Comminuted fracture of shaft of tibia 10/25/2015    Past Surgical History:  Procedure Laterality Date  . knee sugery (left)     Pt states "I think it was a ligament tear"  . TIBIA IM NAIL INSERTION Right 10/26/2015   Procedure: INTRAMEDULLARY (IM) NAIL TIBIAL;  Surgeon: Dannielle HuhSteve Lucey, MD;  Location: [facility] OR;  Service: Orthopedics;  Laterality: Right;        Home Medications    Prior to Admission medications   Not on File    Family History No family history on file.  Social History Social History   Tobacco Use  . Smoking status: Current Every Day Smoker    Types: Cigars  . Smokeless tobacco: Never Used  Substance Use Topics  . Alcohol use: No    Comment: socially  . Drug use: Not on file     Allergies   Patient has no known allergies.   Review of Systems Review of Systems  Constitutional: Negative for fever.  Genitourinary: Negative for decreased urine volume, dysuria, flank pain, genital sores, penile swelling, scrotal swelling and testicular pain.  All  other systems reviewed and are negative.    Physical Exam Updated Vital Signs BP 125/87   Pulse 73   Temp 99 F (37.2 C)   Resp 16   Ht 5\' 9"  (1.753 m)   Wt 76.2 kg (168 lb)   SpO2 97%   BMI 24.81 kg/m   Physical Exam  Constitutional: He appears well-developed and well-nourished. No distress.  HENT:  Head: Atraumatic.  Eyes: Conjunctivae are normal.  Neck: Neck supple.  Genitourinary:  Genitourinary Comments: Chaperone present during exam.  No inguinal lymph adenopathy or inguinal hernia noted.  Normal external genitalia.  No lesions or rash.  Normal circumcised penis without discharge.  Testicle with normal lie.  Scrotum soft nontender  Neurological: He is alert.  Skin: No rash noted.  Psychiatric: He has a normal mood and affect.  Nursing note and vitals reviewed.    ED Treatments / Results  Labs (all labs ordered are listed, but only abnormal results are displayed) Labs Reviewed  RPR  HIV ANTIBODY (ROUTINE TESTING)  URINALYSIS, ROUTINE W REFLEX MICROSCOPIC  GC/CHLAMYDIA PROBE AMP ([place]) [facility]    EKG None  Radiology No results found.  Procedures Procedures (including critical care time)  Medications Ordered in ED Medications  metroNIDAZOLE (FLAGYL) tablet 2,000 mg (has no administration in time range)  cefTRIAXone (ROCEPHIN) injection 250 mg (250 mg Intramuscular Given 10/21/17 1750)  azithromycin (ZITHROMAX) tablet 1,000 mg (1,000 mg Oral Given 10/21/17 1749)  lidocaine (PF) (XYLOCAINE) 1 % injection (0.9 mLs  Given 10/21/17 1750)     Initial Impression / Assessment and Plan / ED Course  I have reviewed the triage vital signs and the nursing notes.  Pertinent labs & imaging results that were available during my care of the patient were reviewed by me and considered in my medical decision making (see chart for details).     BP 125/87   Pulse 73   Temp 99 F (37.2 C)   Resp 16   Ht 5\' 9"  (1.753 m)   Wt 76.2 kg (168 lb)   SpO2 97%    BMI 24.81 kg/m    Final Clinical Impressions(s) / ED Diagnoses   Final diagnoses:  Exposure to STD    ED Discharge Orders    None     6:57 PM Patient here for concern of exposure to STI.  Girlfriend reports she was tested positive for trichomonas 2 days ago.  He has no significant symptoms.  Patient will receive Flagyl 2 g, along with Rocephin Zithromax.  Appropriate STI screening test performed.  Encourage patient to avoid sexual activities until symptoms completely resolved   Fayrene Helper, PA-C 10/21/17 1858    Tegeler, Canary Brim, MD 10/22/17 [phone]
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
## 2017-10-22 LAB — RPR: RPR: NONREACTIVE

## 2017-10-22 LAB — HIV ANTIBODY (ROUTINE TESTING W REFLEX): HIV SCREEN 4TH GENERATION: NONREACTIVE

## 2017-10-24 LAB — GC/CHLAMYDIA PROBE AMP (~~LOC~~) NOT AT ARMC
Chlamydia: NEGATIVE
Neisseria Gonorrhea: NEGATIVE

## 2020-02-26 ENCOUNTER — Emergency Department (HOSPITAL_COMMUNITY): Payer: Self-pay

## 2020-02-26 ENCOUNTER — Emergency Department (HOSPITAL_COMMUNITY)
Admission: EM | Admit: 2020-02-26 | Discharge: 2020-02-26 | Disposition: A | Discharge status: Home / Self Care | Payer: Self-pay | Attending: Emergency Medicine | Admitting: Emergency Medicine

## 2020-02-26 ENCOUNTER — Encounter (HOSPITAL_COMMUNITY): Payer: Self-pay

## 2020-02-26 ENCOUNTER — Other Ambulatory Visit: Payer: Self-pay

## 2020-02-26 DIAGNOSIS — F17290 Nicotine dependence, other tobacco product, uncomplicated: Secondary | ICD-10-CM | POA: Insufficient documentation

## 2020-02-26 DIAGNOSIS — S20211A Contusion of right front wall of thorax, initial encounter: Secondary | ICD-10-CM | POA: Insufficient documentation

## 2020-02-26 MED ORDER — HYDROCODONE-ACETAMINOPHEN 5-325 MG PO TABS: 1.0000 | ORAL_TABLET | Freq: Four times a day (QID) | ORAL | 0 refills | Status: DC | PRN
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
## 2020-02-26 NOTE — Discharge Instructions (Signed)
Take the pain medicine as needed. Use incentive spirometer. Follow-up with your primary care provider. Return to the ER if you start to experience worsening pain, additional injuries, fever or shortness of breath.
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
## 2020-09-06 ENCOUNTER — Emergency Department (HOSPITAL_COMMUNITY)
Admission: EM | Admit: 2020-09-06 | Discharge: 2020-09-06 | Disposition: A | Discharge status: Home / Self Care | Payer: Self-pay | Attending: Emergency Medicine | Admitting: Emergency Medicine

## 2020-09-06 ENCOUNTER — Emergency Department (HOSPITAL_COMMUNITY): Payer: Self-pay

## 2020-09-06 DIAGNOSIS — R0789 Other chest pain: Secondary | ICD-10-CM | POA: Insufficient documentation

## 2020-09-06 DIAGNOSIS — R109 Unspecified abdominal pain: Secondary | ICD-10-CM | POA: Insufficient documentation

## 2020-09-06 DIAGNOSIS — R309 Painful micturition, unspecified: Secondary | ICD-10-CM | POA: Insufficient documentation

## 2020-09-06 DIAGNOSIS — F17290 Nicotine dependence, other tobacco product, uncomplicated: Secondary | ICD-10-CM | POA: Insufficient documentation

## 2020-09-06 DIAGNOSIS — R369 Urethral discharge, unspecified: Secondary | ICD-10-CM | POA: Insufficient documentation

## 2020-09-06 DIAGNOSIS — R059 Cough, unspecified: Secondary | ICD-10-CM | POA: Insufficient documentation

## 2020-09-06 DIAGNOSIS — Z202 Contact with and (suspected) exposure to infections with a predominantly sexual mode of transmission: Secondary | ICD-10-CM | POA: Insufficient documentation

## 2020-09-06 LAB — URINALYSIS, ROUTINE W REFLEX MICROSCOPIC
Bacteria, UA: NONE SEEN
Bilirubin Urine: NEGATIVE
Glucose, UA: NEGATIVE mg/dL
Hgb urine dipstick: NEGATIVE
Ketones, ur: NEGATIVE mg/dL
Nitrite: NEGATIVE
Protein, ur: NEGATIVE mg/dL
Specific Gravity, Urine: 1.02 (ref 1.005–1.030)
pH: 6 (ref 5.0–8.0)

## 2020-09-06 MED ORDER — CEFTRIAXONE SODIUM 1 G IJ SOLR
500.0000 mg | Freq: Once | INTRAMUSCULAR | Status: AC
Start: 2020-09-06 — End: 2020-09-06
  Administered 2020-09-06: 500 mg via INTRAMUSCULAR
  Filled 2020-09-06: qty 10

## 2020-09-06 MED ORDER — DOXYCYCLINE HYCLATE 100 MG PO TABS
100.0000 mg | ORAL_TABLET | Freq: Once | ORAL | Status: AC
  Administered 2020-09-06: 100 mg via ORAL
  Filled 2020-09-06: qty 1

## 2020-09-06 MED ORDER — METRONIDAZOLE 500 MG PO TABS
500.0000 mg | ORAL_TABLET | Freq: Once | ORAL | Status: AC
  Administered 2020-09-06: 500 mg via ORAL
  Filled 2020-09-06: qty 1

## 2020-09-06 MED ORDER — METRONIDAZOLE 500 MG PO TABS: 500.0000 mg | ORAL_TABLET | Freq: Two times a day (BID) | ORAL | 0 refills | Status: DC

## 2020-09-06 MED ORDER — STERILE WATER FOR INJECTION IJ SOLN
INTRAMUSCULAR | Status: AC
  Administered 2020-09-06: 2 mL
  Filled 2020-09-06: qty 10

## 2020-09-06 MED ORDER — DOXYCYCLINE HYCLATE 100 MG PO CAPS: 100.0000 mg | ORAL_CAPSULE | Freq: Two times a day (BID) | ORAL | 0 refills | Status: DC

## 2020-09-06 NOTE — ED Triage Notes (Signed)
Pt complains of left flank pain x 1 month. Pain is worse with movement. Pt denies urinary symptoms or penile discharge. Pt's girlfriend states she is being treated for trichomonas. Pt has unprotected sex.

## 2020-09-06 NOTE — ED Notes (Signed)
ED Provider at bedside. 

## 2020-09-06 NOTE — ED Provider Notes (Signed)
[facility]-EMERGENCY DEPT Provider Note   CSN: [id number] Arrival date & time: 09/06/20  1329     History Chief Complaint  Patient presents with  . Flank Pain    Jamie Lowe is a 35 y.o. male.  HPI Presents for multiple complaints including urethral discharge, burning with urination, bilateral flank pain, cough and chest discomfort.  Onset several days ago after he was exposed to trichomonas infection from his girlfriend.  He denies fever, chills, nausea or vomiting.  No other recent illnesses.  There are no other known modifying factors.    Past Medical History:  Diagnosis Date  . STD (male)     Patient Active Problem List   Diagnosis Date Noted  . Tibia fracture 10/26/2015  . Comminuted fracture of shaft of tibia 10/25/2015    Past Surgical History:  Procedure Laterality Date  . knee sugery (left)     Pt states "I think it was a ligament tear"  . TIBIA IM NAIL INSERTION Right 10/26/2015   Procedure: INTRAMEDULLARY (IM) NAIL TIBIAL;  Surgeon: Dannielle Huh, MD;  Location: [facility] OR;  Service: Orthopedics;  Laterality: Right;       No family history on file.  Social History   Tobacco Use  . Smoking status: Current Every Day Smoker    Types: Cigars  . Smokeless tobacco: Never Used  Substance Use Topics  . Alcohol use: No    Comment: socially    Home Medications Prior to Admission medications   Medication Sig Start Date End Date Taking? Authorizing Provider  doxycycline (VIBRAMYCIN) 100 MG capsule Take 1 capsule (100 mg total) by mouth 2 (two) times daily. One po bid x 7 days 09/06/20  Yes Mancel Bale, MD  metroNIDAZOLE (FLAGYL) 500 MG tablet Take 1 tablet (500 mg total) by mouth 2 (two) times daily. One po bid x 7 days 09/06/20  Yes Mancel Bale, MD  HYDROcodone-acetaminophen (NORCO/VICODIN) 5-325 MG tablet Take 1 tablet by mouth every 6 (six) hours as needed. 02/26/20   Dietrich Pates, PA-C    Allergies    Patient has no known  allergies.  Review of Systems   Review of Systems  All other systems reviewed and are negative.   Physical Exam Updated Vital Signs BP (!) 115/93   Pulse 78   Temp 98 F (36.7 C)   Resp 18   SpO2 98%   Physical Exam Vitals and nursing note reviewed.  Constitutional:      General: He is not in acute distress.    Appearance: He is well-developed. He is not ill-appearing, toxic-appearing or diaphoretic.  HENT:     Head: Normocephalic and atraumatic.     Right Ear: External ear normal.     Left Ear: External ear normal.     Nose: No congestion or rhinorrhea.     Mouth/Throat:     Pharynx: No oropharyngeal exudate or posterior oropharyngeal erythema.  Eyes:     Conjunctiva/sclera: Conjunctivae normal.     Pupils: Pupils are equal, round, and reactive to light.  Neck:     Trachea: Phonation normal.  Cardiovascular:     Rate and Rhythm: Normal rate and regular rhythm.     Heart sounds: Normal heart sounds. No murmur heard. No friction rub.  Pulmonary:     Effort: Pulmonary effort is normal.     Breath sounds: Normal breath sounds.  Abdominal:     General: There is no distension.     Palpations: Abdomen is soft.  Tenderness: There is no abdominal tenderness.  Genitourinary:    Comments: Back with mild left lower lumbar tenderness.  No costovertebral angle tenderness. Musculoskeletal:        General: Normal range of motion.     Cervical back: Normal range of motion and neck supple.  Skin:    General: Skin is warm and dry.  Neurological:     Mental Status: He is alert and oriented to person, place, and time.     Cranial Nerves: No cranial nerve deficit.     Sensory: No sensory deficit.     Motor: No abnormal muscle tone.     Coordination: Coordination normal.  Psychiatric:        Mood and Affect: Mood normal.        Behavior: Behavior normal.        Thought Content: Thought content normal.        Judgment: Judgment normal.     ED Results / Procedures /  Treatments   Labs (all labs ordered are listed, but only abnormal results are displayed) Labs Reviewed  URINALYSIS, ROUTINE W REFLEX MICROSCOPIC - Abnormal; Notable for the following components:      Result Value   Leukocytes,Ua TRACE (*)    All other components within normal limits  GC/CHLAMYDIA PROBE AMP ([place]) NOT AT [facility]    EKG None  Radiology DG Chest 2 View  Result Date: 09/06/2020 CLINICAL DATA:  Cough.  Left flank pain for a month. EXAM: CHEST - 2 VIEW COMPARISON:  February 26, 2020 FINDINGS: The heart size and mediastinal contours are within normal limits. Both lungs are clear. The visualized skeletal structures are unremarkable. IMPRESSION: No active cardiopulmonary disease. Electronically Signed   By: Gerome Sam III M.D   On: 09/06/2020 14:49    Procedures Procedures   Medications Ordered in ED Medications  cefTRIAXone (ROCEPHIN) injection 500 mg (500 mg Intramuscular Given 09/06/20 1511)  metroNIDAZOLE (FLAGYL) tablet 500 mg (500 mg Oral Given 09/06/20 1510)  doxycycline (VIBRA-TABS) tablet 100 mg (100 mg Oral Given 09/06/20 1510)  sterile water (preservative free) injection (2 mLs  Given 09/06/20 1511)    ED Course  I have reviewed the triage vital signs and the nursing notes.  Pertinent labs & imaging results that were available during my care of the patient were reviewed by me and considered in my medical decision making (see chart for details).    MDM Rules/Calculators/A&P                           Patient Vitals for the past 24 hrs:  BP Temp Pulse Resp SpO2  09/06/20 1338 (!) 115/93 98 F (36.7 C) 78 18 98 %      Medical Decision Making:  This patient is presenting for evaluation of problems after exposure to trichomonas, which does require a range of treatment options, and is a complaint that involves a moderate risk of morbidity and mortality. The differential diagnoses include trichomonas urethritis, STDs including gonorrhea chlamydia,  renal stones, pneumonia. I decided to review old records, and in summary healthy young male presenting with varied symptoms, and trichomonas exposure.  I did not require additional historical information from anyone.  Clinical Laboratory Tests Ordered, included STD testing, Urinalysis. Review indicates normal urinalysis, STD tests are pending. Radiologic Tests Ordered, included chest x-ray, CT renal.  I independently Visualized: Radiographic images, which show normal chest x-ray, and CT abdomen pelvis    Critical Interventions-medical  evaluation, laboratory testing, observation, treatment  After These Interventions, the Patient was reevaluated and was found to require empiric treatment for trichomonas and other STDs.  CRITICAL CARE-no Performed by: Mancel Bale  Nursing Notes Reviewed/ Care Coordinated Applicable Imaging Reviewed Interpretation of Laboratory Data incorporated into ED treatment   Nursing Notes Reviewed/ Care Coordinated Applicable Imaging Reviewed Interpretation of Laboratory Data incorporated into ED treatment  The patient appears reasonably screened and/or stabilized for discharge and I doubt any other medical condition or other [facility] requiring further screening, evaluation, or treatment in the ED at this time prior to discharge.  Plan: Home Medications-continue current and use OTC meds of choice; Home Treatments-no sex until partner treated; return here if the recommended treatment, does not improve the symptoms; Recommended follow up-PCP and STD clinic.    Final Clinical Impression(s) / ED Diagnoses Final diagnoses:  STD exposure  Flank pain    Rx / DC Orders ED Discharge Orders         Ordered    metroNIDAZOLE (FLAGYL) 500 MG tablet  2 times daily        09/06/20 1515    doxycycline (VIBRAMYCIN) 100 MG capsule  2 times daily        09/06/20 1515           Mancel Bale, MD 09/06/20 1717

## 2020-09-06 NOTE — Discharge Instructions (Addendum)
The testing does not show any serious problems.  Take the prescribed medicine, to help treat STD infection.  The cultures will return in the next couple of days so you can find out if you actually have the infection or not.  Follow-up with the [facility] department if you continue to have problems with sexually transmitted diseases.  Follow-up with a primary care doctor if needed for ongoing problems with flank pain.  Use Tylenol every 4 hours for pain.

## 2020-09-06 NOTE — ED Notes (Signed)
Patient transported to CT 

## 2020-09-08 LAB — GC/CHLAMYDIA PROBE AMP (~~LOC~~) NOT AT ARMC
Chlamydia: NEGATIVE
Comment: NEGATIVE
Comment: NORMAL
Neisseria Gonorrhea: NEGATIVE

## 2020-10-25 ENCOUNTER — Emergency Department (HOSPITAL_COMMUNITY)
Admission: EM | Admit: 2020-10-25 | Discharge: 2020-10-25 | Disposition: A | Discharge status: Home / Self Care | Payer: Self-pay | Attending: Emergency Medicine | Admitting: Emergency Medicine

## 2020-10-25 ENCOUNTER — Emergency Department (HOSPITAL_COMMUNITY): Admission: EM | Admit: 2020-10-25 | Discharge: 2020-10-25 | Discharge status: Against Medical Advice | Payer: Self-pay

## 2020-10-25 DIAGNOSIS — R369 Urethral discharge, unspecified: Secondary | ICD-10-CM | POA: Insufficient documentation

## 2020-10-25 DIAGNOSIS — Z5321 Procedure and treatment not carried out due to patient leaving prior to being seen by health care provider: Secondary | ICD-10-CM | POA: Insufficient documentation

## 2020-10-25 NOTE — ED Notes (Signed)
Called pt to go back to room. No response.  

## 2020-10-25 NOTE — ED Triage Notes (Signed)
Pt endorses penile discharge. Recent partner has trich.

## 2020-12-15 ENCOUNTER — Emergency Department (HOSPITAL_COMMUNITY)
Admission: EM | Admit: 2020-12-15 | Discharge: 2020-12-15 | Disposition: A | Discharge status: Home / Self Care | Payer: Self-pay | Attending: Emergency Medicine | Admitting: Emergency Medicine

## 2020-12-15 ENCOUNTER — Other Ambulatory Visit: Payer: Self-pay

## 2020-12-15 ENCOUNTER — Encounter (HOSPITAL_COMMUNITY): Payer: Self-pay | Admitting: Emergency Medicine

## 2020-12-15 DIAGNOSIS — R369 Urethral discharge, unspecified: Secondary | ICD-10-CM | POA: Insufficient documentation

## 2020-12-15 DIAGNOSIS — Z5321 Procedure and treatment not carried out due to patient leaving prior to being seen by health care provider: Secondary | ICD-10-CM | POA: Insufficient documentation

## 2020-12-15 NOTE — ED Triage Notes (Signed)
Patient reports penile discharge x4 days. States partner has Therapist, occupational.

## 2021-04-16 ENCOUNTER — Other Ambulatory Visit: Payer: Self-pay

## 2021-04-16 ENCOUNTER — Encounter (HOSPITAL_COMMUNITY): Payer: Self-pay

## 2021-04-16 ENCOUNTER — Emergency Department (HOSPITAL_COMMUNITY)
Admission: EM | Admit: 2021-04-16 | Discharge: 2021-04-16 | Disposition: A | Discharge status: Home / Self Care | Payer: Self-pay | Attending: Emergency Medicine | Admitting: Emergency Medicine

## 2021-04-16 DIAGNOSIS — J069 Acute upper respiratory infection, unspecified: Secondary | ICD-10-CM | POA: Insufficient documentation

## 2021-04-16 DIAGNOSIS — F17290 Nicotine dependence, other tobacco product, uncomplicated: Secondary | ICD-10-CM | POA: Insufficient documentation

## 2021-04-16 LAB — GROUP A STREP BY PCR: Group A Strep by PCR: NOT DETECTED

## 2021-04-16 MED ORDER — BENZONATATE 100 MG PO CAPS: 100.0000 mg | ORAL_CAPSULE | Freq: Three times a day (TID) | ORAL | 0 refills | Status: DC

## 2021-04-16 NOTE — ED Triage Notes (Signed)
Pt c/o sore throat for over a week, pt requesting strept swab.

## 2021-04-16 NOTE — ED Provider Notes (Signed)
[facility]-EMERGENCY DEPT Provider Note   CSN: [id number] Arrival date & time: 04/16/21  1243     History Chief Complaint  Patient presents with   Sore Throat    Jamie Lowe is a 35 y.o. male.  The history is provided by the patient.  Sore Throat This is a new problem. Episode onset: 1 week. Episode frequency: at night. The problem has not changed since onset.Associated symptoms comments: Nasal congestion, nighttime cough.  No fever, wheezing or sob. Exacerbated by: Lying down. Relieved by: Better when up throughout the day. He has tried nothing for the symptoms. The treatment provided no relief.      Past Medical History:  Diagnosis Date   STD (male)     Patient Active Problem List   Diagnosis Date Noted   Tibia fracture 10/26/2015   Comminuted fracture of shaft of tibia 10/25/2015    Past Surgical History:  Procedure Laterality Date   knee sugery (left)     Pt states "I think it was a ligament tear"   TIBIA IM NAIL INSERTION Right 10/26/2015   Procedure: INTRAMEDULLARY (IM) NAIL TIBIAL;  Surgeon: Dannielle Huh, MD;  Location: [facility] OR;  Service: Orthopedics;  Laterality: Right;       History reviewed. No pertinent family history.  Social History   Tobacco Use   Smoking status: Every Day    Types: Cigars   Smokeless tobacco: Never  Substance Use Topics   Alcohol use: No    Comment: socially    Home Medications Prior to Admission medications   Medication Sig Start Date End Date Taking? Authorizing Provider  benzonatate (TESSALON) 100 MG capsule Take 1 capsule (100 mg total) by mouth every 8 (eight) hours. 04/16/21  Yes Gwyneth Sprout, MD  doxycycline (VIBRAMYCIN) 100 MG capsule Take 1 capsule (100 mg total) by mouth 2 (two) times daily. One po bid x 7 days 09/06/20   Mancel Bale, MD  HYDROcodone-acetaminophen (NORCO/VICODIN) 5-325 MG tablet Take 1 tablet by mouth every 6 (six) hours as needed. 02/26/20   Khatri, Hina, PA-C   metroNIDAZOLE (FLAGYL) 500 MG tablet Take 1 tablet (500 mg total) by mouth 2 (two) times daily. One po bid x 7 days 09/06/20   Mancel Bale, MD    Allergies    Patient has no known allergies.  Review of Systems   Review of Systems  All other systems reviewed and are negative.  Physical Exam Updated Vital Signs BP (!) 145/98 (BP Location: Left Arm)   Pulse 90   Temp 98.1 F (36.7 C) (Oral)   Resp 16   SpO2 98%   Physical Exam Vitals and nursing note reviewed.  Constitutional:      General: He is not in acute distress.    Appearance: He is well-developed.  HENT:     Head: Normocephalic and atraumatic.     Nose: Congestion present.     Mouth/Throat:     Mouth: Mucous membranes are moist.     Pharynx: Oropharynx is clear. Uvula midline. No pharyngeal swelling, oropharyngeal exudate, posterior oropharyngeal erythema or uvula swelling.  Eyes:     Conjunctiva/sclera: Conjunctivae normal.     Pupils: Pupils are equal, round, and reactive to light.  Cardiovascular:     Rate and Rhythm: Normal rate and regular rhythm.     Heart sounds: No murmur heard. Pulmonary:     Effort: Pulmonary effort is normal. No respiratory distress.     Breath sounds: Normal breath sounds. No wheezing  or rales.  Musculoskeletal:        General: No tenderness. Normal range of motion.     Cervical back: Normal range of motion and neck supple.  Lymphadenopathy:     Cervical: No cervical adenopathy.  Skin:    General: Skin is warm and dry.     Findings: No erythema or rash.  Neurological:     Mental Status: He is alert and oriented to person, place, and time.  Psychiatric:        Behavior: Behavior normal.    ED Results / Procedures / Treatments   Labs (all labs ordered are listed, but only abnormal results are displayed) Labs Reviewed  GROUP A STREP BY PCR    EKG None  Radiology No results found.  Procedures Procedures   Medications Ordered in ED Medications - No data to  display  ED Course  I have reviewed the triage vital signs and the nursing notes.  Pertinent labs & imaging results that were available during my care of the patient were reviewed by me and considered in my medical decision making (see chart for details).    MDM Rules/Calculators/A&P                           Pt with symptoms consistent with viral URI.  Well appearing here.  No signs of breathing difficulty  No signs of pharyngitis, otitis or abnormal abdominal findings.   Strep neg.  Will have pt try otc meds.  Symptoms seem most consistent with a persistent postnasal drip causing cough throughout the night which then makes his throat sore.  Final Clinical Impression(s) / ED Diagnoses Final diagnoses:  Viral upper respiratory tract infection    Rx / DC Orders ED Discharge Orders          Ordered    benzonatate (TESSALON) 100 MG capsule  Every 8 hours        04/16/21 1345             Gwyneth Sprout, MD 04/16/21 1423

## 2021-04-16 NOTE — Discharge Instructions (Signed)
Try over the counter saline nasal spray and sudafed to help dry out the sinuses.  If that doesn't work you can try the cough medication.

## 2021-10-22 ENCOUNTER — Encounter (HOSPITAL_COMMUNITY): Payer: Self-pay

## 2021-10-22 ENCOUNTER — Other Ambulatory Visit: Payer: Self-pay

## 2021-10-22 ENCOUNTER — Emergency Department (HOSPITAL_COMMUNITY)
Admission: EM | Admit: 2021-10-22 | Discharge: 2021-10-23 | Disposition: A | Discharge status: Home / Self Care | Payer: Self-pay | Attending: Emergency Medicine | Admitting: Emergency Medicine

## 2021-10-22 DIAGNOSIS — R002 Palpitations: Secondary | ICD-10-CM | POA: Insufficient documentation

## 2021-10-22 NOTE — ED Triage Notes (Signed)
Pt presents to ED with c/o left side chest pain. Pt states he began to have palpitations today and states he feels a burning sensation in his throat when he eats. Denies hx of GERD.

## 2021-10-23 LAB — CBC
HCT: 42.8 % (ref 39.0–52.0)
Hemoglobin: 14.8 g/dL (ref 13.0–17.0)
MCH: 34.2 pg — ABNORMAL HIGH (ref 26.0–34.0)
MCHC: 34.6 g/dL (ref 30.0–36.0)
MCV: 98.8 fL (ref 80.0–100.0)
Platelets: 287 10*3/uL (ref 150–400)
RBC: 4.33 MIL/uL (ref 4.22–5.81)
RDW: 13.2 % (ref 11.5–15.5)
WBC: 8 10*3/uL (ref 4.0–10.5)
nRBC: 0 % (ref 0.0–0.2)

## 2021-10-23 LAB — RPR: RPR Ser Ql: NONREACTIVE

## 2021-10-23 LAB — BASIC METABOLIC PANEL
Anion gap: 9 (ref 5–15)
BUN: 8 mg/dL (ref 6–20)
CO2: 23 mmol/L (ref 22–32)
Calcium: 8.9 mg/dL (ref 8.9–10.3)
Chloride: 106 mmol/L (ref 98–111)
Creatinine, Ser: 1.11 mg/dL (ref 0.61–1.24)
GFR, Estimated: >60 mL/min (ref >60)
Glucose, Bld: 88 mg/dL (ref 70–99)
Potassium: 3.6 mmol/L (ref 3.5–5.1)
Sodium: 138 mmol/L (ref 135–145)

## 2021-10-23 LAB — RAPID URINE DRUG SCREEN, HOSP PERFORMED
Amphetamines: NOT DETECTED
Barbiturates: NOT DETECTED
Benzodiazepines: NOT DETECTED
Cocaine: POSITIVE — AB
Opiates: NOT DETECTED
Tetrahydrocannabinol: NOT DETECTED

## 2021-10-23 LAB — TROPONIN I (HIGH SENSITIVITY)
Troponin I (High Sensitivity): 5 ng/L (ref <18)
Troponin I (High Sensitivity): 6 ng/L (ref <18)

## 2021-10-23 LAB — RAPID HIV SCREEN (HIV 1/2 AB+AG)
HIV 1/2 Antibodies: NONREACTIVE
HIV-1 P24 Antigen - HIV24: NONREACTIVE

## 2021-10-23 NOTE — ED Notes (Signed)
Called lab to run HIV and RPR - they advised they have the tubes and will run them.

## 2021-10-23 NOTE — ED Notes (Signed)
Save blue and 3 gold saved in main lab(PT requesting STD check)

## 2021-10-23 NOTE — ED Provider Notes (Signed)
[facility]-EMERGENCY DEPT [facility] Emergency Department Provider Note MRN:  [id number]  Arrival date & time: 10/23/21     Chief Complaint   Chest Pain   History of Present Illness   Jamie Lowe is a 36 y.o. year-old male presents to the ED with chief complaint of palpitations.  He states that he used some "[employer]" that may have had some cocaine and it last night.  He states that he was also drinking heavily.  States that he awoke with some palpitations earlier today.  States that these symptoms have resolved.  He also states that he would like to be tested for STDs, but denies dysuria or discharge.  History provided by patient.   Review of Systems  Pertinent review of systems noted in HPI.    Physical Exam   Vitals:   10/23/21 0245 10/23/21 0300  BP: 121/88   Pulse: 64   Resp: 11   Temp:  97.7 F (36.5 C)  SpO2: 100%     CONSTITUTIONAL:  well-appearing, NAD NEURO:  Alert and oriented x 3, CN 3-12 grossly intact EYES:  eyes equal and reactive ENT/NECK:  Supple, no stridor  CARDIO:  normal, regular rhythm, appears well-perfused  PULM:  No respiratory distress,  GI/GU:  non-distended,  MSK/SPINE:  No gross deformities, no edema, moves all extremities  SKIN:  no rash, atraumatic   *Additional and/or pertinent findings included in MDM below  Diagnostic and Interventional Summary    EKG Interpretation  Date/Time:  Thursday October 22 2021 23:56:24 EDT Ventricular Rate:  79 PR Interval:  154 QRS Duration: 81 QT Interval:  400 QTC Calculation: 459 R Axis:   16 Text Interpretation: Sinus rhythm Confirmed by Nicanor Alcon, April ([id number]) on 10/23/2021 12:03:18 AM       Labs Reviewed  CBC - Abnormal; Notable for the following components:      Result Value   MCH 34.2 (*)    All other components within normal limits  RAPID URINE DRUG SCREEN, HOSP PERFORMED - Abnormal; Notable for the following components:   Cocaine POSITIVE (*)    All other components within normal  limits  BASIC METABOLIC PANEL  RAPID HIV SCREEN (HIV 1/2 AB+AG)  RPR  GC/CHLAMYDIA PROBE AMP ([place]) NOT AT [facility]  TROPONIN I (HIGH SENSITIVITY)  TROPONIN I (HIGH SENSITIVITY)    No orders to display    Medications - No data to display   Procedures  /  Critical Care Procedures  ED Course and Medical Decision Making  I have reviewed the triage vital signs, the nursing notes, and pertinent available records from the EMR.  Social Determinants Affecting Complexity of Care: Patient has no clinically significant social determinants affecting this chief complaint..   ED Course:   Patient here with palpitations.  Top differential diagnoses include ACS, dysrhythmia, electrolyte abnormality, substance abuse . Medical Decision Making Patient here with palpitations.  States that he used some bad Kirt [employer] that may have been laced with cocaine.  I suspect that his symptoms are associated with this.  He also asked for STD testing, which I provided.  Test results are reassuring and patient feels improved, I feel that he can be safely discharged.  Problems Addressed: Palpitations: acute illness or injury  Amount and/or Complexity of Data Reviewed Labs: ordered.    Details: No electrolyte derangement, no leukocytosis, troponins are negative x2 ECG/medicine tests: ordered and independent interpretation performed.    Details: No dysrhythmia     Consultants: No consultations were needed in caring  for this patient.   Treatment and Plan: Emergency department workup does not suggest an emergent condition requiring admission or immediate intervention beyond  what has been performed at this time. The patient is safe for discharge and has  been instructed to return immediately for worsening symptoms, change in  symptoms or any other concerns    Final Clinical Impressions(s) / ED Diagnoses     ICD-10-CM   1. Palpitations  R00.2       ED Discharge Orders     None         Discharge  Instructions Discussed with and Provided to Patient:   Discharge Instructions   None      Roxy Horseman, PA-C 10/23/21 1950    Palumbo, April, MD 10/23/21 [phone]

## 2021-10-23 NOTE — ED Notes (Signed)
Requisition for GC/Chlamydia sent to lab.

## 2021-10-28 LAB — GC/CHLAMYDIA PROBE AMP (~~LOC~~) NOT AT ARMC
Chlamydia: NEGATIVE
Comment: NEGATIVE
Comment: NORMAL
Neisseria Gonorrhea: NEGATIVE

## 2022-01-06 ENCOUNTER — Encounter (HOSPITAL_COMMUNITY): Payer: Self-pay

## 2022-01-06 ENCOUNTER — Other Ambulatory Visit: Payer: Self-pay

## 2022-01-06 ENCOUNTER — Emergency Department (HOSPITAL_COMMUNITY)
Admission: EM | Admit: 2022-01-06 | Discharge: 2022-01-06 | Disposition: A | Discharge status: Home / Self Care | Payer: Self-pay | Attending: Emergency Medicine | Admitting: Emergency Medicine

## 2022-01-06 DIAGNOSIS — Z5321 Procedure and treatment not carried out due to patient leaving prior to being seen by health care provider: Secondary | ICD-10-CM | POA: Insufficient documentation

## 2022-01-06 DIAGNOSIS — R369 Urethral discharge, unspecified: Secondary | ICD-10-CM | POA: Insufficient documentation

## 2022-01-06 NOTE — ED Triage Notes (Signed)
Pt reports clear, cloudy penile discharge. Pt reports a recent partner of his has trichomonas. Denies abdominal pain.

## 2022-02-13 ENCOUNTER — Other Ambulatory Visit: Payer: Self-pay

## 2022-02-13 ENCOUNTER — Encounter (HOSPITAL_COMMUNITY): Payer: Self-pay

## 2022-02-13 ENCOUNTER — Emergency Department (HOSPITAL_COMMUNITY)
Admission: EM | Admit: 2022-02-13 | Discharge: 2022-02-13 | Disposition: A | Discharge status: Home / Self Care | Payer: Self-pay | Attending: Emergency Medicine | Admitting: Emergency Medicine

## 2022-02-13 DIAGNOSIS — S39012A Strain of muscle, fascia and tendon of lower back, initial encounter: Secondary | ICD-10-CM

## 2022-02-13 DIAGNOSIS — M545 Low back pain, unspecified: Secondary | ICD-10-CM | POA: Insufficient documentation

## 2022-02-13 MED ORDER — LIDOCAINE 5 % EX PTCH: 1.0000 | MEDICATED_PATCH | CUTANEOUS | 0 refills | Status: AC

## 2022-02-13 MED ORDER — METHOCARBAMOL 500 MG PO TABS: 500.0000 mg | ORAL_TABLET | Freq: Two times a day (BID) | ORAL | 0 refills | Status: AC

## 2022-02-13 MED ORDER — METHOCARBAMOL 500 MG PO TABS
1000.0000 mg | ORAL_TABLET | Freq: Once | ORAL | Status: AC
  Administered 2022-02-13: 1000 mg via ORAL
  Filled 2022-02-13: qty 2

## 2022-02-13 MED ORDER — LIDOCAINE 5 % EX PTCH
2.0000 | MEDICATED_PATCH | CUTANEOUS | Status: DC
  Administered 2022-02-13: 2 via TRANSDERMAL
  Filled 2022-02-13: qty 2

## 2022-02-13 MED ORDER — NAPROXEN 500 MG PO TABS: 500.0000 mg | ORAL_TABLET | Freq: Two times a day (BID) | ORAL | 0 refills | Status: AC

## 2022-02-13 MED ORDER — NAPROXEN 500 MG PO TABS: 500.0000 mg | ORAL_TABLET | Freq: Once | ORAL | Status: DC

## 2022-02-13 NOTE — ED Provider Notes (Signed)
[place] DEPT Provider Note   CSN: [id number] Arrival date & time: 02/13/22  0844     History  Chief Complaint  Patient presents with   Back Pain    Jamie Lowe is a 36 y.o. male presenting with back pain.  He reports that on Thursday he was out at a bar and when he got in his truck he all of a sudden felt low back pain.  Since then he has used Tylenol and warm rags to try and treat his back pain.  No previous back injury.  No saddle anesthesia, bowel/bladder dysfunction, leg weakness, falls or other concerning symptoms.  Also denies any hematuria or dysuria.  No fevers or chills.   Back Pain      Home Medications Prior to Admission medications   Medication Sig Start Date End Date Taking? Authorizing Provider  lidocaine (LIDODERM) 5 % Place 1 patch onto the skin daily. Remove & Discard patch within 12 hours or as directed by MD 02/13/22  Yes Cherysh Epperly A, PA-C  methocarbamol (ROBAXIN) 500 MG tablet Take 1 tablet (500 mg total) by mouth 2 (two) times daily. 02/13/22  Yes Prisilla Kocsis A, PA-C  naproxen (NAPROSYN) 500 MG tablet Take 1 tablet (500 mg total) by mouth 2 (two) times daily. 02/13/22  Yes Michelena Culmer A, PA-C      Allergies    Patient has no known allergies.    Review of Systems   Review of Systems  Musculoskeletal:  Positive for back pain.    Physical Exam Updated Vital Signs BP (!) 136/111 (BP Location: Right Arm)   Pulse 72   Temp 98.2 F (36.8 C) (Oral)   Resp 16   Ht 5\' 9"  (1.753 m)   Wt 78.9 kg   SpO2 100%   BMI 25.70 kg/m  Physical Exam Vitals and nursing note reviewed.  Constitutional:      Appearance: Normal appearance.  HENT:     Head: Normocephalic and atraumatic.  Eyes:     General: No scleral icterus.    Conjunctiva/sclera: Conjunctivae normal.  Pulmonary:     Effort: Pulmonary effort is normal. No respiratory distress.  Skin:    General: Skin is warm and dry.     Findings: No rash.   Neurological:     Mental Status: He is alert.  Psychiatric:        Mood and Affect: Mood normal.     ED Results / Procedures / Treatments   Labs (all labs ordered are listed, but only abnormal results are displayed) Labs Reviewed - No data to display  EKG None  Radiology No results found.  Procedures Procedures   Medications Ordered in ED Medications  lidocaine (LIDODERM) 5 % 2 patch (2 patches Transdermal Patch Applied 02/13/22 1033)  methocarbamol (ROBAXIN) tablet 1,000 mg (1,000 mg Oral Given 02/13/22 1031)    ED Course/ Medical Decision Making/ A&P                           Medical Decision Making Risk Prescription drug management.   This is a 36 year old male who presents to the ED for concern of back pain.  The differential diagnosis for back pain includes but is not limited to fracture, muscle strain, cauda equina, spinal stenosis. DDD, vertebral osteomyelitis, diskitis, kidney stone, pyelonephritis, AAA, Perforated ulcer, Rpancreatitis, or obstruction   This is not an exhaustive differential.    Past Medical History / Co-morbidities /  Social History: No known past medical history however does drink alcohol daily   Physical Exam: Pertinent physical exam findings include Reproducible tenderness on the paraspinals of the bilateral lumbar spine.  Negative CVA  Lab Tests: Considered lab work to assess for hematuria or change in kidney function for nephrolithiasis but do not believe it is indicated at this time   Imaging Studies: Patient had no trauma, do not believe imaging is necessary.  Low suspicion kidney stone, will defer CT   Medications: I ordered medication including Robaxin and lidocaine patch.  Patient would like to hold on NSAIDs until he eats breakfast..  MDM/Disposition: This is a 36 year old male who presented today with back pain.  Appears to be nontraumatic and started after getting in his truck.  High suspicion for muscle strain due to  patient's history and physical exam.  Worse with palpation as well is any motions such as twisting and flexion of the lumbar spine.  Still has full range of motion.  Negative CVA.  Highly doubt nephrolithiasis at this time.  Patient and his significant other are agreeable to conservative management with NSAIDs, lidocaine patch and muscle relaxants.  Proper use was discussed and he was discharged home with primary care referral as well as sports medicine referral.  No red flags and ambulated out of the department.  D Diagnoses Final diagnoses:  Acute bilateral low back pain without sciatica    Rx / DC Orders ED Discharge Orders          Ordered    methocarbamol (ROBAXIN) 500 MG tablet  2 times daily        02/13/22 0947    naproxen (NAPROSYN) 500 MG tablet  2 times daily        02/13/22 0947    lidocaine (LIDODERM) 5 %  Every 24 hours        02/13/22 0947           Results and diagnoses were explained to the patient. Return precautions discussed in full. Patient had no additional questions and expressed complete understanding.   This chart was dictated using voice recognition software.  Despite best efforts to proofread,  errors can occur which can change the documentation meaning.    Saddie Benders, PA-C 02/13/22 1048    Gloris Manchester, MD 02/14/22 1311

## 2022-02-13 NOTE — Discharge Instructions (Addendum)
I have sent the following medications to the pharmacy Methocarbamol.  This is a muscle relaxant, be sure to not take it in the morning if you have things to do because it may make you drowsy Naproxen is an NSAID.  This is a similar medication to ibuprofen so do not take them together.  Try and take it with food.  If it is not helping you may switch to ibuprofen instead.  Please do not drink alcohol with this medication or the muscle relaxant Lidocaine patches.  You may use up to 3 patches every 12 hours.  Be sure not to leave it on for more than 12 hours to avoid too much lidocaine in your system.  Other at home therapies include massages, heat packs and stretching.  I have attached multiple primary care offices to these discharge papers as well as a sports medicine physician for you to follow-up with as needed.  It was a pleasure to meet you, please return with any worsening symptoms and we hope you feel better!

## 2022-02-13 NOTE — ED Triage Notes (Signed)
Patient ac/o bilateral lower back pain x 2 days. Patient denies any problems with urinating, urinary frequency, or radiation of pain into his legs. Patient denies any heavy lifting or injury to the back.

## 2022-07-24 ENCOUNTER — Other Ambulatory Visit: Payer: Self-pay

## 2022-07-24 ENCOUNTER — Emergency Department (HOSPITAL_COMMUNITY)
Admission: EM | Admit: 2022-07-24 | Discharge: 2022-07-24 | Disposition: A | Discharge status: Home / Self Care | Payer: Self-pay | Attending: Emergency Medicine | Admitting: Emergency Medicine

## 2022-07-24 DIAGNOSIS — Z202 Contact with and (suspected) exposure to infections with a predominantly sexual mode of transmission: Secondary | ICD-10-CM | POA: Insufficient documentation

## 2022-07-24 DIAGNOSIS — R369 Urethral discharge, unspecified: Secondary | ICD-10-CM | POA: Insufficient documentation

## 2022-07-24 LAB — URINALYSIS, ROUTINE W REFLEX MICROSCOPIC
Bacteria, UA: NONE SEEN
Bilirubin Urine: NEGATIVE
Glucose, UA: NEGATIVE mg/dL
Hgb urine dipstick: NEGATIVE
Ketones, ur: NEGATIVE mg/dL
Nitrite: NEGATIVE
Protein, ur: NEGATIVE mg/dL
Specific Gravity, Urine: 1.023 (ref 1.005–1.030)
pH: 7 (ref 5.0–8.0)

## 2022-07-24 MED ORDER — LIDOCAINE HCL (PF) 1 % IJ SOLN
INTRAMUSCULAR | Status: AC
  Administered 2022-07-24: 30 mL
  Filled 2022-07-24: qty 30

## 2022-07-24 MED ORDER — DOXYCYCLINE HYCLATE 100 MG PO TABS
100.0000 mg | ORAL_TABLET | Freq: Once | ORAL | Status: AC
  Administered 2022-07-24: 100 mg via ORAL
  Filled 2022-07-24: qty 1

## 2022-07-24 MED ORDER — CEFTRIAXONE SODIUM 1 G IJ SOLR
500.0000 mg | Freq: Once | INTRAMUSCULAR | Status: AC
Start: 2022-07-24 — End: 2022-07-24
  Administered 2022-07-24: 500 mg via INTRAMUSCULAR
  Filled 2022-07-24: qty 10

## 2022-07-24 MED ORDER — DOXYCYCLINE HYCLATE 100 MG PO CAPS: 100.0000 mg | ORAL_CAPSULE | Freq: Two times a day (BID) | ORAL | 0 refills | Status: AC

## 2022-07-24 NOTE — Discharge Instructions (Signed)
Return to ED with any new or worsening signs or symptoms Follow-up on the results of testing done here today on MyChart.  If testing is negative, please continue use of doxycycline Please abstain from sex for 7 days after taking last antibiotic
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
## 2022-07-26 LAB — GC/CHLAMYDIA PROBE AMP (~~LOC~~) NOT AT ARMC
Chlamydia: POSITIVE — AB
Comment: NEGATIVE
Comment: NORMAL
Neisseria Gonorrhea: NEGATIVE

## 2023-03-05 ENCOUNTER — Emergency Department (HOSPITAL_COMMUNITY): Payer: Medicaid Other | Admitting: Anesthesiology

## 2023-03-05 ENCOUNTER — Emergency Department (HOSPITAL_COMMUNITY): Payer: Medicaid Other

## 2023-03-05 ENCOUNTER — Inpatient Hospital Stay (HOSPITAL_COMMUNITY)
Admission: EM | Admit: 2023-03-05 | Discharge: 2023-03-12 | DRG: 958 | Disposition: A | Discharge status: Home / Self Care | Payer: Medicaid Other | Attending: Surgery | Admitting: Surgery

## 2023-03-05 ENCOUNTER — Inpatient Hospital Stay (HOSPITAL_COMMUNITY): Payer: Medicaid Other

## 2023-03-05 ENCOUNTER — Encounter (HOSPITAL_COMMUNITY): Admission: EM | Disposition: A | Discharge status: Home / Self Care | Payer: Self-pay | Source: Home / Self Care

## 2023-03-05 ENCOUNTER — Other Ambulatory Visit: Payer: Self-pay

## 2023-03-05 DIAGNOSIS — T1490XA Injury, unspecified, initial encounter: Secondary | ICD-10-CM | POA: Diagnosis present

## 2023-03-05 DIAGNOSIS — W3400XA Accidental discharge from unspecified firearms or gun, initial encounter: Principal | ICD-10-CM

## 2023-03-05 DIAGNOSIS — S36408A Unspecified injury of other part of small intestine, initial encounter: Secondary | ICD-10-CM

## 2023-03-05 DIAGNOSIS — E876 Hypokalemia: Secondary | ICD-10-CM | POA: Diagnosis not present

## 2023-03-05 DIAGNOSIS — S32402B Unspecified fracture of left acetabulum, initial encounter for open fracture: Secondary | ICD-10-CM | POA: Diagnosis present

## 2023-03-05 DIAGNOSIS — S31133A Puncture wound of abdominal wall without foreign body, right lower quadrant without penetration into peritoneal cavity, initial encounter: Secondary | ICD-10-CM | POA: Diagnosis present

## 2023-03-05 DIAGNOSIS — D62 Acute posthemorrhagic anemia: Secondary | ICD-10-CM | POA: Diagnosis present

## 2023-03-05 DIAGNOSIS — Z23 Encounter for immunization: Secondary | ICD-10-CM | POA: Diagnosis not present

## 2023-03-05 DIAGNOSIS — F17200 Nicotine dependence, unspecified, uncomplicated: Secondary | ICD-10-CM | POA: Diagnosis present

## 2023-03-05 DIAGNOSIS — S36438A Laceration of other part of small intestine, initial encounter: Secondary | ICD-10-CM | POA: Diagnosis present

## 2023-03-05 DIAGNOSIS — S72052B Unspecified fracture of head of left femur, initial encounter for open fracture type I or II: Secondary | ICD-10-CM | POA: Diagnosis present

## 2023-03-05 HISTORY — PX: LAPAROTOMY: SHX154

## 2023-03-05 LAB — POCT I-STAT 7, (LYTES, BLD GAS, ICA,H+H)
Acid-base deficit: 11 mmol/L — ABNORMAL HIGH (ref 0.0–2.0)
Acid-base deficit: 11 mmol/L — ABNORMAL HIGH (ref 0.0–2.0)
Bicarbonate: 15.3 mmol/L — ABNORMAL LOW (ref 20.0–28.0)
Bicarbonate: 17 mmol/L — ABNORMAL LOW (ref 20.0–28.0)
Calcium, Ion: 1.06 mmol/L — ABNORMAL LOW (ref 1.15–1.40)
Calcium, Ion: 1.05 mmol/L — ABNORMAL LOW (ref 1.15–1.40)
HCT: 34 % — ABNORMAL LOW (ref 39.0–52.0)
HCT: 37 % — ABNORMAL LOW (ref 39.0–52.0)
Hemoglobin: 11.6 g/dL — ABNORMAL LOW (ref 13.0–17.0)
Hemoglobin: 12.6 g/dL — ABNORMAL LOW (ref 13.0–17.0)
O2 Saturation: 100 %
O2 Saturation: 100 %
Patient temperature: 35
Patient temperature: 35.2
Potassium: 3.1 mmol/L — ABNORMAL LOW (ref 3.5–5.1)
Potassium: 3.8 mmol/L (ref 3.5–5.1)
Sodium: 140 mmol/L (ref 135–145)
Sodium: 140 mmol/L (ref 135–145)
TCO2: 16 mmol/L — ABNORMAL LOW (ref 22–32)
TCO2: 18 mmol/L — ABNORMAL LOW (ref 22–32)
pCO2 arterial: 33.4 mmHg (ref 32–48)
pCO2 arterial: 41.9 mmHg (ref 32–48)
pH, Arterial: 7.259 — ABNORMAL LOW (ref 7.35–7.45)
pH, Arterial: 7.206 — ABNORMAL LOW (ref 7.35–7.45)
pO2, Arterial: 315 mmHg — ABNORMAL HIGH (ref 83–108)
pO2, Arterial: 358 mmHg — ABNORMAL HIGH (ref 83–108)

## 2023-03-05 LAB — CBC
HCT: 38.6 % — ABNORMAL LOW (ref 39.0–52.0)
Hemoglobin: 13.2 g/dL (ref 13.0–17.0)
MCH: 32.9 pg (ref 26.0–34.0)
MCHC: 34.2 g/dL (ref 30.0–36.0)
MCV: 96.3 fL (ref 80.0–100.0)
Platelets: 184 10*3/uL (ref 150–400)
RBC: 4.01 MIL/uL — ABNORMAL LOW (ref 4.22–5.81)
RDW: 16.6 % — ABNORMAL HIGH (ref 11.5–15.5)
WBC: 8.7 10*3/uL (ref 4.0–10.5)
nRBC: 0 % (ref 0.0–0.2)

## 2023-03-05 LAB — I-STAT CHEM 8, ED
BUN: 5 mg/dL — ABNORMAL LOW (ref 6–20)
Calcium, Ion: 1.07 mmol/L — ABNORMAL LOW (ref 1.15–1.40)
Chloride: 103 mmol/L (ref 98–111)
Creatinine, Ser: 1.8 mg/dL — ABNORMAL HIGH (ref 0.61–1.24)
Glucose, Bld: 254 mg/dL — ABNORMAL HIGH (ref 70–99)
HCT: 42 % (ref 39.0–52.0)
Hemoglobin: 14.3 g/dL (ref 13.0–17.0)
Potassium: 2.8 mmol/L — ABNORMAL LOW (ref 3.5–5.1)
Sodium: 140 mmol/L (ref 135–145)
TCO2: 15 mmol/L — ABNORMAL LOW (ref 22–32)

## 2023-03-05 LAB — URINALYSIS, ROUTINE W REFLEX MICROSCOPIC
Bacteria, UA: NONE SEEN
Bilirubin Urine: NEGATIVE
Glucose, UA: NEGATIVE mg/dL
Ketones, ur: NEGATIVE mg/dL
Leukocytes,Ua: NEGATIVE
Nitrite: NEGATIVE
Protein, ur: NEGATIVE mg/dL
Specific Gravity, Urine: 1.034 — ABNORMAL HIGH (ref 1.005–1.030)
pH: 7 (ref 5.0–8.0)

## 2023-03-05 LAB — PREPARE RBC (CROSSMATCH)

## 2023-03-05 LAB — COMPREHENSIVE METABOLIC PANEL
ALT: 29 U/L (ref 0–44)
AST: 34 U/L (ref 15–41)
Albumin: 4.3 g/dL (ref 3.5–5.0)
Alkaline Phosphatase: 56 U/L (ref 38–126)
Anion gap: 13 (ref 5–15)
BUN: 5 mg/dL — ABNORMAL LOW (ref 6–20)
CO2: 18 mmol/L — ABNORMAL LOW (ref 22–32)
Calcium: 7.8 mg/dL — ABNORMAL LOW (ref 8.9–10.3)
Chloride: 108 mmol/L (ref 98–111)
Creatinine, Ser: 1.1 mg/dL (ref 0.61–1.24)
GFR, Estimated: >60 mL/min (ref >60)
Glucose, Bld: 154 mg/dL — ABNORMAL HIGH (ref 70–99)
Potassium: 3.6 mmol/L (ref 3.5–5.1)
Sodium: 139 mmol/L (ref 135–145)
Total Bilirubin: 1 mg/dL (ref 0.3–1.2)
Total Protein: 6.6 g/dL (ref 6.5–8.1)

## 2023-03-05 LAB — LACTIC ACID, PLASMA: Lactic Acid, Venous: 2.9 mmol/L (ref 0.5–1.9)

## 2023-03-05 LAB — I-STAT CG4 LACTIC ACID, ED: Lactic Acid, Venous: 5.8 mmol/L (ref 0.5–1.9)

## 2023-03-05 LAB — ABO/RH: ABO/RH(D): A POS

## 2023-03-05 LAB — HIV ANTIBODY (ROUTINE TESTING W REFLEX): HIV Screen 4th Generation wRfx: NONREACTIVE

## 2023-03-05 SURGERY — LAPAROTOMY, EXPLORATORY
Anesthesia: General

## 2023-03-05 MED ORDER — OXYCODONE HCL 5 MG PO TABS: 5.0000 mg | ORAL_TABLET | Freq: Once | ORAL | Status: AC | PRN

## 2023-03-05 MED ORDER — OXYCODONE HCL 5 MG/5ML PO SOLN
5.0000 mg | Freq: Once | ORAL | Status: AC | PRN
  Administered 2023-03-05: 5 mg

## 2023-03-05 MED ORDER — 0.9 % SODIUM CHLORIDE (POUR BTL) OPTIME
TOPICAL | Status: DC | PRN
  Administered 2023-03-05 (×3): 1000 mL

## 2023-03-05 MED ORDER — HYDRALAZINE HCL 20 MG/ML IJ SOLN
10.0000 mg | INTRAMUSCULAR | Status: DC | PRN
  Administered 2023-03-06: 10 mg via INTRAVENOUS
  Filled 2023-03-05: qty 1

## 2023-03-05 MED ORDER — PHENYLEPHRINE 80 MCG/ML (10ML) SYRINGE FOR IV PUSH (FOR BLOOD PRESSURE SUPPORT)
PREFILLED_SYRINGE | INTRAVENOUS | Status: AC
  Filled 2023-03-05: qty 10

## 2023-03-05 MED ORDER — ROCURONIUM BROMIDE 10 MG/ML (PF) SYRINGE
PREFILLED_SYRINGE | INTRAVENOUS | Status: AC
  Filled 2023-03-05: qty 10

## 2023-03-05 MED ORDER — PIPERACILLIN-TAZOBACTAM 3.375 G IVPB
3.3750 g | Freq: Three times a day (TID) | INTRAVENOUS | Status: DC
  Administered 2023-03-05 – 2023-03-11 (×17): 3.375 g via INTRAVENOUS
  Filled 2023-03-05 (×17): qty 50

## 2023-03-05 MED ORDER — HYDROMORPHONE HCL 1 MG/ML IJ SOLN
INTRAMUSCULAR | Status: AC
  Filled 2023-03-05: qty 1

## 2023-03-05 MED ORDER — SODIUM CHLORIDE 0.9 % IV SOLN: 10.0000 mL/h | Freq: Once | INTRAVENOUS | Status: DC

## 2023-03-05 MED ORDER — FENTANYL CITRATE (PF) 250 MCG/5ML IJ SOLN
INTRAMUSCULAR | Status: DC | PRN
  Administered 2023-03-05: 100 ug via INTRAVENOUS
  Administered 2023-03-05 (×3): 50 ug via INTRAVENOUS

## 2023-03-05 MED ORDER — ONDANSETRON 4 MG PO TBDP: 4.0000 mg | ORAL_TABLET | Freq: Four times a day (QID) | ORAL | Status: DC | PRN

## 2023-03-05 MED ORDER — HYDROMORPHONE HCL 1 MG/ML IJ SOLN
INTRAMUSCULAR | Status: AC
  Filled 2023-03-05: qty 0.5

## 2023-03-05 MED ORDER — AMISULPRIDE (ANTIEMETIC) 5 MG/2ML IV SOLN
10.0000 mg | Freq: Once | INTRAVENOUS | Status: AC
  Administered 2023-03-05: 10 mg via INTRAVENOUS

## 2023-03-05 MED ORDER — PHENYLEPHRINE HCL-NACL 20-0.9 MG/250ML-% IV SOLN
INTRAVENOUS | Status: DC | PRN
  Administered 2023-03-05: 50 ug/min via INTRAVENOUS
  Administered 2023-03-05: 100 ug/min via INTRAVENOUS

## 2023-03-05 MED ORDER — POLYETHYLENE GLYCOL 3350 17 G PO PACK
17.0000 g | PACK | Freq: Every day | ORAL | Status: DC | PRN
  Administered 2023-03-09: 17 g via ORAL
  Filled 2023-03-05: qty 1

## 2023-03-05 MED ORDER — ONDANSETRON HCL 4 MG/2ML IJ SOLN
4.0000 mg | Freq: Four times a day (QID) | INTRAMUSCULAR | Status: DC | PRN
  Administered 2023-03-06 – 2023-03-11 (×7): 4 mg via INTRAVENOUS
  Filled 2023-03-05 (×7): qty 2

## 2023-03-05 MED ORDER — PROPOFOL 10 MG/ML IV BOLUS
INTRAVENOUS | Status: AC
  Filled 2023-03-05: qty 20

## 2023-03-05 MED ORDER — FENTANYL CITRATE (PF) 250 MCG/5ML IJ SOLN
INTRAMUSCULAR | Status: AC
  Filled 2023-03-05: qty 5

## 2023-03-05 MED ORDER — DEXAMETHASONE SODIUM PHOSPHATE 10 MG/ML IJ SOLN
INTRAMUSCULAR | Status: DC | PRN
  Administered 2023-03-05: 10 mg via INTRAVENOUS

## 2023-03-05 MED ORDER — OXYCODONE HCL 5 MG/5ML PO SOLN: 10.0000 mg | Freq: Once | ORAL | Status: DC | PRN

## 2023-03-05 MED ORDER — OXYCODONE HCL 5 MG PO TABS
5.0000 mg | ORAL_TABLET | ORAL | Status: DC | PRN
  Administered 2023-03-05 – 2023-03-08 (×8): 5 mg via ORAL
  Filled 2023-03-05 (×8): qty 1

## 2023-03-05 MED ORDER — TETANUS-DIPHTH-ACELL PERTUSSIS 5-2.5-18.5 LF-MCG/0.5 IM SUSY
PREFILLED_SYRINGE | INTRAMUSCULAR | Status: AC
  Filled 2023-03-05: qty 0.5

## 2023-03-05 MED ORDER — SUCCINYLCHOLINE CHLORIDE 200 MG/10ML IV SOSY
PREFILLED_SYRINGE | INTRAVENOUS | Status: DC | PRN
  Administered 2023-03-05: 140 mg via INTRAVENOUS

## 2023-03-05 MED ORDER — METOPROLOL TARTRATE 5 MG/5ML IV SOLN
5.0000 mg | Freq: Four times a day (QID) | INTRAVENOUS | Status: DC | PRN
  Administered 2023-03-07: 5 mg via INTRAVENOUS
  Filled 2023-03-05: qty 5

## 2023-03-05 MED ORDER — ONDANSETRON HCL 4 MG/2ML IJ SOLN
INTRAMUSCULAR | Status: DC | PRN
  Administered 2023-03-05: 4 mg via INTRAVENOUS

## 2023-03-05 MED ORDER — SUCCINYLCHOLINE CHLORIDE 200 MG/10ML IV SOSY
PREFILLED_SYRINGE | INTRAVENOUS | Status: AC
  Filled 2023-03-05: qty 10

## 2023-03-05 MED ORDER — PROPOFOL 10 MG/ML IV BOLUS
INTRAVENOUS | Status: DC | PRN
  Administered 2023-03-05: 150 mg via INTRAVENOUS

## 2023-03-05 MED ORDER — TETANUS-DIPHTH-ACELL PERTUSSIS 5-2.5-18.5 LF-MCG/0.5 IM SUSY
0.5000 mL | PREFILLED_SYRINGE | Freq: Once | INTRAMUSCULAR | Status: AC
  Administered 2023-03-05: 0.5 mL via INTRAMUSCULAR

## 2023-03-05 MED ORDER — MIDAZOLAM HCL 2 MG/2ML IJ SOLN
INTRAMUSCULAR | Status: AC
  Filled 2023-03-05: qty 2

## 2023-03-05 MED ORDER — MIDAZOLAM HCL 2 MG/2ML IJ SOLN
INTRAMUSCULAR | Status: DC | PRN
  Administered 2023-03-05: 2 mg via INTRAVENOUS

## 2023-03-05 MED ORDER — METHOCARBAMOL 500 MG PO TABS
500.0000 mg | ORAL_TABLET | Freq: Three times a day (TID) | ORAL | Status: AC
  Administered 2023-03-06 – 2023-03-08 (×3): 500 mg via ORAL
  Filled 2023-03-05 (×4): qty 1

## 2023-03-05 MED ORDER — SODIUM BICARBONATE 8.4 % IV SOLN
INTRAVENOUS | Status: DC | PRN
  Administered 2023-03-05: 50 meq via INTRAVENOUS

## 2023-03-05 MED ORDER — METHOCARBAMOL 1000 MG/10ML IJ SOLN
500.0000 mg | Freq: Three times a day (TID) | INTRAMUSCULAR | Status: AC
  Administered 2023-03-05 – 2023-03-08 (×4): 500 mg via INTRAVENOUS
  Filled 2023-03-05 (×4): qty 10

## 2023-03-05 MED ORDER — SUGAMMADEX SODIUM 200 MG/2ML IV SOLN
INTRAVENOUS | Status: DC | PRN
  Administered 2023-03-05: 300 mg via INTRAVENOUS

## 2023-03-05 MED ORDER — ACETAMINOPHEN 500 MG PO TABS
1000.0000 mg | ORAL_TABLET | Freq: Four times a day (QID) | ORAL | Status: DC
  Administered 2023-03-05 – 2023-03-12 (×16): 1000 mg via ORAL
  Filled 2023-03-05 (×16): qty 2

## 2023-03-05 MED ORDER — VASOPRESSIN 20 UNIT/ML IV SOLN
INTRAVENOUS | Status: AC
  Filled 2023-03-05: qty 1

## 2023-03-05 MED ORDER — HYDROMORPHONE HCL 1 MG/ML IJ SOLN
INTRAMUSCULAR | Status: AC
  Administered 2023-03-05: 1 mg
  Filled 2023-03-05: qty 1

## 2023-03-05 MED ORDER — AMISULPRIDE (ANTIEMETIC) 5 MG/2ML IV SOLN
INTRAVENOUS | Status: AC
  Filled 2023-03-05: qty 4

## 2023-03-05 MED ORDER — LIDOCAINE 2% (20 MG/ML) 5 ML SYRINGE
INTRAMUSCULAR | Status: DC | PRN
  Administered 2023-03-05: 60 mg via INTRAVENOUS

## 2023-03-05 MED ORDER — DOCUSATE SODIUM 100 MG PO CAPS
100.0000 mg | ORAL_CAPSULE | Freq: Two times a day (BID) | ORAL | Status: DC
  Administered 2023-03-05 – 2023-03-12 (×11): 100 mg via ORAL
  Filled 2023-03-05 (×12): qty 1

## 2023-03-05 MED ORDER — PHENYLEPHRINE 80 MCG/ML (10ML) SYRINGE FOR IV PUSH (FOR BLOOD PRESSURE SUPPORT)
PREFILLED_SYRINGE | INTRAVENOUS | Status: DC | PRN
  Administered 2023-03-05: 240 ug via INTRAVENOUS

## 2023-03-05 MED ORDER — SODIUM BICARBONATE 8.4 % IV SOLN
INTRAVENOUS | Status: AC
  Filled 2023-03-05: qty 50

## 2023-03-05 MED ORDER — DEXAMETHASONE SODIUM PHOSPHATE 10 MG/ML IJ SOLN
INTRAMUSCULAR | Status: AC
  Filled 2023-03-05: qty 1

## 2023-03-05 MED ORDER — PIPERACILLIN-TAZOBACTAM 3.375 G IVPB 30 MIN
3.3750 g | Freq: Once | INTRAVENOUS | Status: AC
  Administered 2023-03-05: 3.375 g via INTRAVENOUS

## 2023-03-05 MED ORDER — HYDROMORPHONE HCL 1 MG/ML IJ SOLN
1.0000 mg | INTRAMUSCULAR | Status: DC | PRN
  Administered 2023-03-05 – 2023-03-09 (×14): 1 mg via INTRAVENOUS
  Filled 2023-03-05 (×14): qty 1

## 2023-03-05 MED ORDER — OXYCODONE HCL 5 MG PO TABS: 5.0000 mg | ORAL_TABLET | Freq: Once | ORAL | Status: DC | PRN

## 2023-03-05 MED ORDER — ROCURONIUM BROMIDE 10 MG/ML (PF) SYRINGE
PREFILLED_SYRINGE | INTRAVENOUS | Status: DC | PRN
  Administered 2023-03-05: 60 mg via INTRAVENOUS

## 2023-03-05 MED ORDER — ONDANSETRON HCL 4 MG/2ML IJ SOLN
INTRAMUSCULAR | Status: AC
  Filled 2023-03-05: qty 2

## 2023-03-05 MED ORDER — HYDROMORPHONE HCL 1 MG/ML IJ SOLN
0.2500 mg | INTRAMUSCULAR | Status: DC | PRN
  Administered 2023-03-05 (×4): 0.5 mg via INTRAVENOUS

## 2023-03-05 MED ORDER — ONDANSETRON HCL 4 MG/2ML IJ SOLN: 4.0000 mg | Freq: Four times a day (QID) | INTRAMUSCULAR | Status: DC | PRN

## 2023-03-05 MED ORDER — IOHEXOL 350 MG/ML SOLN
75.0000 mL | Freq: Once | INTRAVENOUS | Status: AC | PRN
  Administered 2023-03-05: 75 mL via INTRAVENOUS

## 2023-03-05 MED ORDER — OXYCODONE HCL 5 MG/5ML PO SOLN: 5.0000 mg | Freq: Once | ORAL | Status: DC | PRN

## 2023-03-05 MED ORDER — OXYCODONE HCL 5 MG/5ML PO SOLN
ORAL | Status: AC
  Filled 2023-03-05: qty 10

## 2023-03-05 MED ORDER — SODIUM CHLORIDE 0.9 % IV SOLN: INTRAVENOUS | Status: DC | PRN

## 2023-03-05 MED ORDER — KCL IN DEXTROSE-NACL 20-5-0.45 MEQ/L-%-% IV SOLN
INTRAVENOUS | Status: AC
  Filled 2023-03-05: qty 1000

## 2023-03-05 MED ORDER — LACTATED RINGERS IV SOLN: INTRAVENOUS | Status: DC | PRN

## 2023-03-05 MED ORDER — LIDOCAINE 2% (20 MG/ML) 5 ML SYRINGE
INTRAMUSCULAR | Status: AC
  Filled 2023-03-05: qty 5

## 2023-03-05 MED ORDER — HEMOSTATIC AGENTS (NO CHARGE) OPTIME
TOPICAL | Status: DC | PRN
Start: 2023-03-05 — End: 2023-03-05
  Administered 2023-03-05: 1 via TOPICAL

## 2023-03-05 MED ORDER — ALBUMIN HUMAN 5 % IV SOLN
INTRAVENOUS | Status: DC | PRN
Start: 2023-03-05 — End: 2023-03-05

## 2023-03-05 MED ORDER — LACTATED RINGERS IV SOLN
INTRAVENOUS | Status: DC | PRN
Start: 2023-03-05 — End: 2023-03-05

## 2023-03-05 MED ORDER — VASOPRESSIN 20 UNIT/ML IV SOLN
INTRAVENOUS | Status: DC | PRN
Start: 2023-03-05 — End: 2023-03-05
  Administered 2023-03-05: 2 [IU] via INTRAVENOUS

## 2023-03-05 MED ORDER — HYDROMORPHONE HCL 1 MG/ML IJ SOLN
INTRAMUSCULAR | Status: DC | PRN
Start: 2023-03-05 — End: 2023-03-05
  Administered 2023-03-05: .5 mg via INTRAVENOUS

## 2023-03-05 SURGICAL SUPPLY — 59 items
APL PRP STRL LF DISP 70% ISPRP (MISCELLANEOUS) ×1
BAG COUNTER SPONGE SURGICOUNT (BAG) ×1 IMPLANT
BAG SPNG CNTER NS LX DISP (BAG) ×1
BLADE CLIPPER SURG (BLADE) IMPLANT
BNDG GAUZE DERMACEA FLUFF 4 (GAUZE/BANDAGES/DRESSINGS) IMPLANT
BNDG GZE DERMACEA 4 6PLY (GAUZE/BANDAGES/DRESSINGS) ×1
CANISTER SUCT 3000ML PPV (MISCELLANEOUS) ×1 IMPLANT
CHLORAPREP W/TINT 26 (MISCELLANEOUS) ×1 IMPLANT
COVER SURGICAL LIGHT HANDLE (MISCELLANEOUS) ×1 IMPLANT
DRAPE INCISE IOBAN 66X45 STRL (DRAPES) IMPLANT
DRAPE LAPAROSCOPIC ABDOMINAL (DRAPES) ×1 IMPLANT
DRAPE LAPAROTOMY 100X72X124 (DRAPES) IMPLANT
DRAPE WARM FLUID 44X44 (DRAPES) ×1 IMPLANT
DRSG OPSITE POSTOP 4X10 (GAUZE/BANDAGES/DRESSINGS) IMPLANT
DRSG OPSITE POSTOP 4X8 (GAUZE/BANDAGES/DRESSINGS) IMPLANT
ELECT BLADE 4.0 EZ CLEAN MEGAD (MISCELLANEOUS) ×1
ELECT BLADE 6.5 EXT (BLADE) IMPLANT
ELECT CAUTERY BLADE 6.4 (BLADE) ×1 IMPLANT
ELECT REM PT RETURN 9FT ADLT (ELECTROSURGICAL) ×1
ELECTRODE BLDE 4.0 EZ CLN MEGD (MISCELLANEOUS) IMPLANT
ELECTRODE REM PT RTRN 9FT ADLT (ELECTROSURGICAL) ×1 IMPLANT
GAUZE PAD ABD 8X10 STRL (GAUZE/BANDAGES/DRESSINGS) IMPLANT
GAUZE SPONGE 4X4 12PLY STRL (GAUZE/BANDAGES/DRESSINGS) IMPLANT
GLOVE BIO SURGEON STRL SZ7 (GLOVE) ×1 IMPLANT
GLOVE BIO SURGEON STRL SZ8 (GLOVE) IMPLANT
GLOVE BIOGEL PI IND STRL 7.5 (GLOVE) ×1 IMPLANT
GLOVE BIOGEL PI IND STRL 8 (GLOVE) IMPLANT
GOWN STRL REUS W/ TWL LRG LVL3 (GOWN DISPOSABLE) ×2 IMPLANT
GOWN STRL REUS W/TWL LRG LVL3 (GOWN DISPOSABLE) ×2
GOWN STRL REUS W/TWL XL LVL3 (GOWN DISPOSABLE) ×1 IMPLANT
HANDLE SUCTION POOLE (INSTRUMENTS) ×1 IMPLANT
HEMOSTAT SNOW SURGICEL 2X4 (HEMOSTASIS) IMPLANT
KIT BASIN OR (CUSTOM PROCEDURE TRAY) ×1 IMPLANT
KIT TURNOVER KIT B (KITS) ×1 IMPLANT
LIGASURE IMPACT 36 18CM CVD LR (INSTRUMENTS) IMPLANT
NS IRRIG 1000ML POUR BTL (IV SOLUTION) ×2 IMPLANT
PACK GENERAL/GYN (CUSTOM PROCEDURE TRAY) ×1 IMPLANT
PAD ABD 8X10 STRL (GAUZE/BANDAGES/DRESSINGS) IMPLANT
PAD ARMBOARD 7.5X6 YLW CONV (MISCELLANEOUS) ×1 IMPLANT
RELOAD PROXIMATE 75MM BLUE (ENDOMECHANICALS) ×3 IMPLANT
RELOAD STAPLE 75 3.8 BLU REG (ENDOMECHANICALS) IMPLANT
SPONGE T-LAP 18X18 ~~LOC~~+RFID (SPONGE) IMPLANT
STAPLER PROXIMATE 75MM BLUE (STAPLE) IMPLANT
STAPLER VISISTAT 35W (STAPLE) IMPLANT
SUCTION POOLE HANDLE (INSTRUMENTS) ×1
SUT PDS AB 1 TP1 54 (SUTURE) IMPLANT
SUT PDS AB 1 TP1 96 (SUTURE) ×2 IMPLANT
SUT SILK 2 0 SH CR/8 (SUTURE) ×1 IMPLANT
SUT SILK 2 0 TIES 10X30 (SUTURE) ×1 IMPLANT
SUT SILK 3 0 SH CR/8 (SUTURE) ×1 IMPLANT
SUT SILK 3 0 TIES 10X30 (SUTURE) ×1 IMPLANT
SUT SILK 3 0SH CR/8 30 (SUTURE) IMPLANT
SUT VIC AB 3-0 SH 18 (SUTURE) IMPLANT
SUT VIC AB 3-0 SH 27 (SUTURE) ×5
SUT VIC AB 3-0 SH 27X BRD (SUTURE) IMPLANT
TAPE CLOTH SURG 4X10 WHT LF (GAUZE/BANDAGES/DRESSINGS) IMPLANT
TOWEL GREEN STERILE (TOWEL DISPOSABLE) ×1 IMPLANT
TRAY FOLEY MTR SLVR 16FR STAT (SET/KITS/TRAYS/PACK) IMPLANT
YANKAUER SUCT BULB TIP NO VENT (SUCTIONS) IMPLANT

## 2023-03-05 NOTE — Anesthesia Procedure Notes (Signed)
Arterial Line Insertion Start/End10/19/2024 7:17 AM, 03/05/2023 7:22 AM Performed by: Lovie Chol, CRNA, CRNA  Patient location: OR. Emergency situation Patient sedated Right, radial was placed Catheter size: 20 G Hand hygiene performed   Attempts: 1 Procedure performed without using ultrasound guided technique. Following insertion, dressing applied and Biopatch. Post procedure assessment: normal  Patient tolerated the procedure well with no immediate complications.
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
## 2023-03-05 NOTE — Anesthesia Procedure Notes (Signed)
Procedure Name: Intubation Date/Time: 03/05/2023 7:11 AM  Performed by: Little Ishikawa, CRNAPre-anesthesia Checklist: Patient identified, Emergency Drugs available, Suction available, Timeout performed and Patient being monitored Patient Re-evaluated:Patient Re-evaluated prior to induction Oxygen Delivery Method: Circle system utilized Preoxygenation: Pre-oxygenation with 100% oxygen Induction Type: IV induction and Rapid sequence Ventilation: Mask ventilation without difficulty Laryngoscope Size: Mac and 4 Grade View: Grade II Tube type: Oral Tube size: 7.5 mm Number of attempts: 1 Airway Equipment and Method: Stylet Placement Confirmation: ETT inserted through vocal cords under direct vision, positive ETCO2, CO2 detector and breath sounds checked- equal and bilateral Secured at: 23 cm Tube secured with: Tape Dental Injury: Teeth and Oropharynx as per pre-operative assessment

## 2023-03-05 NOTE — OR Nursing (Signed)
Patient transported to CT at this time. 
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
## 2023-03-06 ENCOUNTER — Encounter (HOSPITAL_COMMUNITY): Payer: Self-pay | Admitting: General Surgery

## 2023-03-06 LAB — CBC
HCT: 36.7 % — ABNORMAL LOW (ref 39.0–52.0)
Hemoglobin: 13.1 g/dL (ref 13.0–17.0)
MCH: 33.5 pg (ref 26.0–34.0)
MCHC: 35.7 g/dL (ref 30.0–36.0)
MCV: 93.9 fL (ref 80.0–100.0)
Platelets: 183 10*3/uL (ref 150–400)
RBC: 3.91 MIL/uL — ABNORMAL LOW (ref 4.22–5.81)
RDW: 16 % — ABNORMAL HIGH (ref 11.5–15.5)
WBC: 11.4 10*3/uL — ABNORMAL HIGH (ref 4.0–10.5)
nRBC: 0 % (ref 0.0–0.2)

## 2023-03-06 LAB — BASIC METABOLIC PANEL
Anion gap: 10 (ref 5–15)
BUN: 8 mg/dL (ref 6–20)
CO2: 23 mmol/L (ref 22–32)
Calcium: 8.9 mg/dL (ref 8.9–10.3)
Chloride: 103 mmol/L (ref 98–111)
Creatinine, Ser: 1.1 mg/dL (ref 0.61–1.24)
GFR, Estimated: >60 mL/min (ref >60)
Glucose, Bld: 121 mg/dL — ABNORMAL HIGH (ref 70–99)
Potassium: 3.6 mmol/L (ref 3.5–5.1)
Sodium: 136 mmol/L (ref 135–145)

## 2023-03-06 NOTE — Plan of Care (Signed)

## 2023-03-06 NOTE — Progress Notes (Signed)
Consultation request received. Possible surgery tomorrow but anticipate Tue.  NPO for now. Will d/w colleagues but nonoperative management still remains consideration at this time.  Myrene Galas, MD [facility], [facility] [phone]
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
## 2023-03-07 ENCOUNTER — Inpatient Hospital Stay (HOSPITAL_COMMUNITY): Payer: Medicaid Other

## 2023-03-07 LAB — BASIC METABOLIC PANEL
Anion gap: 10 (ref 5–15)
BUN: 8 mg/dL (ref 6–20)
CO2: 24 mmol/L (ref 22–32)
Calcium: 9 mg/dL (ref 8.9–10.3)
Chloride: 102 mmol/L (ref 98–111)
Creatinine, Ser: 1.09 mg/dL (ref 0.61–1.24)
GFR, Estimated: >60 mL/min (ref >60)
Glucose, Bld: 126 mg/dL — ABNORMAL HIGH (ref 70–99)
Potassium: 3.4 mmol/L — ABNORMAL LOW (ref 3.5–5.1)
Sodium: 136 mmol/L (ref 135–145)

## 2023-03-07 LAB — CBC
HCT: 38.4 % — ABNORMAL LOW (ref 39.0–52.0)
Hemoglobin: 13.6 g/dL (ref 13.0–17.0)
MCH: 33.3 pg (ref 26.0–34.0)
MCHC: 35.4 g/dL (ref 30.0–36.0)
MCV: 94.1 fL (ref 80.0–100.0)
Platelets: 198 10*3/uL (ref 150–400)
RBC: 4.08 MIL/uL — ABNORMAL LOW (ref 4.22–5.81)
RDW: 16 % — ABNORMAL HIGH (ref 11.5–15.5)
WBC: 12.5 10*3/uL — ABNORMAL HIGH (ref 4.0–10.5)
nRBC: 0 % (ref 0.0–0.2)

## 2023-03-07 MED ORDER — PROCHLORPERAZINE EDISYLATE 10 MG/2ML IJ SOLN
10.0000 mg | Freq: Four times a day (QID) | INTRAMUSCULAR | Status: DC | PRN
  Administered 2023-03-07 – 2023-03-12 (×9): 10 mg via INTRAVENOUS
  Filled 2023-03-07 (×9): qty 2

## 2023-03-07 MED ORDER — ENOXAPARIN SODIUM 40 MG/0.4ML IJ SOSY
40.0000 mg | PREFILLED_SYRINGE | INTRAMUSCULAR | Status: DC
Start: 2023-03-07 — End: 2023-03-10
  Administered 2023-03-07 – 2023-03-09 (×3): 40 mg via SUBCUTANEOUS
  Filled 2023-03-07 (×3): qty 0.4

## 2023-03-07 MED ORDER — KCL IN DEXTROSE-NACL 20-5-0.45 MEQ/L-%-% IV SOLN
INTRAVENOUS | Status: AC
Start: 2023-03-07 — End: 2023-03-08
  Filled 2023-03-07 (×3): qty 1000
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
## 2023-03-07 NOTE — Plan of Care (Signed)

## 2023-03-08 LAB — BASIC METABOLIC PANEL
Anion gap: 13 (ref 5–15)
BUN: 10 mg/dL (ref 6–20)
CO2: 24 mmol/L (ref 22–32)
Calcium: 9.2 mg/dL (ref 8.9–10.3)
Chloride: 99 mmol/L (ref 98–111)
Creatinine, Ser: 0.91 mg/dL (ref 0.61–1.24)
GFR, Estimated: >60 mL/min (ref >60)
Glucose, Bld: 142 mg/dL — ABNORMAL HIGH (ref 70–99)
Potassium: 3.3 mmol/L — ABNORMAL LOW (ref 3.5–5.1)
Sodium: 136 mmol/L (ref 135–145)

## 2023-03-08 LAB — GLUCOSE, CAPILLARY: Glucose-Capillary: 149 mg/dL — ABNORMAL HIGH (ref 70–99)

## 2023-03-08 LAB — CBC
HCT: 37.5 % — ABNORMAL LOW (ref 39.0–52.0)
Hemoglobin: 13.3 g/dL (ref 13.0–17.0)
MCH: 33.7 pg (ref 26.0–34.0)
MCHC: 35.5 g/dL (ref 30.0–36.0)
MCV: 94.9 fL (ref 80.0–100.0)
Platelets: 204 10*3/uL (ref 150–400)
RBC: 3.95 MIL/uL — ABNORMAL LOW (ref 4.22–5.81)
RDW: 16 % — ABNORMAL HIGH (ref 11.5–15.5)
WBC: 10.5 10*3/uL (ref 4.0–10.5)
nRBC: 0 % (ref 0.0–0.2)

## 2023-03-08 LAB — TYPE AND SCREEN
ABO/RH(D): A POS
Antibody Screen: NEGATIVE

## 2023-03-08 LAB — PHOSPHORUS: Phosphorus: 3.3 mg/dL (ref 2.5–4.6)

## 2023-03-08 LAB — SURGICAL PATHOLOGY

## 2023-03-08 LAB — MAGNESIUM: Magnesium: 2 mg/dL (ref 1.7–2.4)

## 2023-03-08 MED ORDER — HYDRALAZINE HCL 20 MG/ML IJ SOLN
10.0000 mg | INTRAMUSCULAR | Status: DC | PRN
  Administered 2023-03-08 – 2023-03-09 (×2): 10 mg via INTRAVENOUS
  Filled 2023-03-08: qty 1

## 2023-03-08 MED ORDER — SODIUM CHLORIDE 0.9% FLUSH: 10.0000 mL | INTRAVENOUS | Status: DC | PRN

## 2023-03-08 NOTE — Progress Notes (Signed)
Patient stated "I have more questions before my surgery, I'll sign it after I talk to the surgeon tomorrow" Informed consent form placed in patient's chart. Will notify oncoming RN.   Florinda Marker, RN
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
## 2023-03-08 NOTE — Progress Notes (Signed)
Dressing changed per order following order details. No remarkable findings. Patient tolerated well. PRN pain meds given prior to dressing change. Florinda Marker, RN

## 2023-03-09 ENCOUNTER — Inpatient Hospital Stay (HOSPITAL_COMMUNITY): Payer: Medicaid Other

## 2023-03-09 ENCOUNTER — Inpatient Hospital Stay (HOSPITAL_COMMUNITY): Payer: Medicaid Other | Admitting: Anesthesiology

## 2023-03-09 ENCOUNTER — Encounter (HOSPITAL_COMMUNITY): Admission: EM | Disposition: A | Discharge status: Home / Self Care | Payer: Self-pay | Source: Home / Self Care

## 2023-03-09 ENCOUNTER — Other Ambulatory Visit: Payer: Self-pay

## 2023-03-09 ENCOUNTER — Encounter (HOSPITAL_COMMUNITY): Payer: Self-pay

## 2023-03-09 DIAGNOSIS — S71032A Puncture wound without foreign body, left hip, initial encounter: Secondary | ICD-10-CM

## 2023-03-09 HISTORY — PX: I & D EXTREMITY: SHX5045

## 2023-03-09 LAB — PHOSPHORUS: Phosphorus: 3.6 mg/dL (ref 2.5–4.6)

## 2023-03-09 LAB — BPAM RBC
Blood Product Expiration Date: 202411182359
Blood Product Expiration Date: 202411182359
Blood Product Expiration Date: 202411182359
Blood Product Expiration Date: 202411182359
ISSUE DATE / TIME: 202410190808
ISSUE DATE / TIME: 202410190808
ISSUE DATE / TIME: 202410190808
ISSUE DATE / TIME: 202410190808
Unit Type and Rh: 6200
Unit Type and Rh: 6200
Unit Type and Rh: 6200
Unit Type and Rh: 6200

## 2023-03-09 LAB — TYPE AND SCREEN
ABO/RH(D): A POS
Antibody Screen: NEGATIVE
Unit division: 0
Unit division: 0
Unit division: 0
Unit division: 0

## 2023-03-09 LAB — CBC
HCT: 35.2 % — ABNORMAL LOW (ref 39.0–52.0)
Hemoglobin: 12.6 g/dL — ABNORMAL LOW (ref 13.0–17.0)
MCH: 33.8 pg (ref 26.0–34.0)
MCHC: 35.8 g/dL (ref 30.0–36.0)
MCV: 94.4 fL (ref 80.0–100.0)
Platelets: 227 10*3/uL (ref 150–400)
RBC: 3.73 MIL/uL — ABNORMAL LOW (ref 4.22–5.81)
RDW: 15.6 % — ABNORMAL HIGH (ref 11.5–15.5)
WBC: 12.1 10*3/uL — ABNORMAL HIGH (ref 4.0–10.5)
nRBC: 0 % (ref 0.0–0.2)

## 2023-03-09 LAB — BASIC METABOLIC PANEL
Anion gap: 13 (ref 5–15)
BUN: 8 mg/dL (ref 6–20)
CO2: 25 mmol/L (ref 22–32)
Calcium: 9.4 mg/dL (ref 8.9–10.3)
Chloride: 98 mmol/L (ref 98–111)
Creatinine, Ser: 0.93 mg/dL (ref 0.61–1.24)
GFR, Estimated: >60 mL/min (ref >60)
Glucose, Bld: 111 mg/dL — ABNORMAL HIGH (ref 70–99)
Potassium: 3 mmol/L — ABNORMAL LOW (ref 3.5–5.1)
Sodium: 136 mmol/L (ref 135–145)

## 2023-03-09 LAB — MAGNESIUM: Magnesium: 2 mg/dL (ref 1.7–2.4)

## 2023-03-09 SURGERY — IRRIGATION AND DEBRIDEMENT EXTREMITY
Anesthesia: General | Site: Hip | Laterality: Left

## 2023-03-09 MED ORDER — ORAL CARE MOUTH RINSE: 15.0000 mL | Freq: Once | OROMUCOSAL | Status: AC

## 2023-03-09 MED ORDER — ROCURONIUM BROMIDE 10 MG/ML (PF) SYRINGE
PREFILLED_SYRINGE | INTRAVENOUS | Status: AC
  Filled 2023-03-09: qty 10

## 2023-03-09 MED ORDER — PHENYLEPHRINE 80 MCG/ML (10ML) SYRINGE FOR IV PUSH (FOR BLOOD PRESSURE SUPPORT)
PREFILLED_SYRINGE | INTRAVENOUS | Status: AC
  Filled 2023-03-09: qty 10

## 2023-03-09 MED ORDER — METHOCARBAMOL 500 MG PO TABS
1000.0000 mg | ORAL_TABLET | Freq: Four times a day (QID) | ORAL | Status: DC
  Administered 2023-03-09 – 2023-03-12 (×8): 1000 mg via ORAL
  Filled 2023-03-09 (×9): qty 2

## 2023-03-09 MED ORDER — LIDOCAINE 5 % EX PTCH
2.0000 | MEDICATED_PATCH | CUTANEOUS | Status: DC
  Administered 2023-03-10: 2 via TRANSDERMAL
  Filled 2023-03-09 (×2): qty 2

## 2023-03-09 MED ORDER — OXYCODONE HCL 5 MG/5ML PO SOLN: 5.0000 mg | Freq: Once | ORAL | Status: DC | PRN

## 2023-03-09 MED ORDER — 0.9 % SODIUM CHLORIDE (POUR BTL) OPTIME
TOPICAL | Status: DC | PRN
  Administered 2023-03-09: 1000 mL

## 2023-03-09 MED ORDER — HYDROMORPHONE HCL 1 MG/ML IJ SOLN
INTRAMUSCULAR | Status: AC
  Administered 2023-03-10: 1 mg via INTRAVENOUS
  Filled 2023-03-09: qty 1

## 2023-03-09 MED ORDER — MIDAZOLAM HCL 2 MG/2ML IJ SOLN
INTRAMUSCULAR | Status: DC | PRN
  Administered 2023-03-09: 2 mg via INTRAVENOUS

## 2023-03-09 MED ORDER — HYDROMORPHONE HCL 1 MG/ML IJ SOLN
1.0000 mg | INTRAMUSCULAR | Status: DC | PRN
  Administered 2023-03-09 – 2023-03-11 (×6): 1 mg via INTRAVENOUS
  Filled 2023-03-09 (×7): qty 1

## 2023-03-09 MED ORDER — FENTANYL CITRATE (PF) 250 MCG/5ML IJ SOLN
INTRAMUSCULAR | Status: AC
  Filled 2023-03-09: qty 5

## 2023-03-09 MED ORDER — ONDANSETRON HCL 4 MG/2ML IJ SOLN: 4.0000 mg | Freq: Once | INTRAMUSCULAR | Status: DC | PRN

## 2023-03-09 MED ORDER — CEFAZOLIN SODIUM-DEXTROSE 2-4 GM/100ML-% IV SOLN
INTRAVENOUS | Status: AC
  Filled 2023-03-09: qty 100

## 2023-03-09 MED ORDER — SUCCINYLCHOLINE CHLORIDE 200 MG/10ML IV SOSY
PREFILLED_SYRINGE | INTRAVENOUS | Status: DC | PRN
  Administered 2023-03-09: 120 mg via INTRAVENOUS

## 2023-03-09 MED ORDER — MIDAZOLAM HCL 2 MG/2ML IJ SOLN
INTRAMUSCULAR | Status: AC
  Filled 2023-03-09: qty 2

## 2023-03-09 MED ORDER — SODIUM CHLORIDE 0.9 % IV SOLN: INTRAVENOUS | Status: DC | PRN

## 2023-03-09 MED ORDER — POTASSIUM CHLORIDE 10 MEQ/100ML IV SOLN
INTRAVENOUS | Status: AC
  Filled 2023-03-09: qty 100

## 2023-03-09 MED ORDER — DEXMEDETOMIDINE HCL IN NACL 80 MCG/20ML IV SOLN
INTRAVENOUS | Status: AC
  Filled 2023-03-09: qty 20

## 2023-03-09 MED ORDER — PHENYLEPHRINE 80 MCG/ML (10ML) SYRINGE FOR IV PUSH (FOR BLOOD PRESSURE SUPPORT)
PREFILLED_SYRINGE | INTRAVENOUS | Status: DC | PRN
  Administered 2023-03-09: 80 ug via INTRAVENOUS

## 2023-03-09 MED ORDER — KETAMINE HCL 10 MG/ML IJ SOLN
INTRAMUSCULAR | Status: DC | PRN
  Administered 2023-03-09: 50 mg via INTRAVENOUS

## 2023-03-09 MED ORDER — OXYCODONE HCL 5 MG PO TABS: 5.0000 mg | ORAL_TABLET | Freq: Once | ORAL | Status: DC | PRN

## 2023-03-09 MED ORDER — DEXAMETHASONE SODIUM PHOSPHATE 10 MG/ML IJ SOLN
INTRAMUSCULAR | Status: AC
  Filled 2023-03-09: qty 1

## 2023-03-09 MED ORDER — DEXAMETHASONE SODIUM PHOSPHATE 10 MG/ML IJ SOLN
INTRAMUSCULAR | Status: DC | PRN
  Administered 2023-03-09: 10 mg via INTRAVENOUS

## 2023-03-09 MED ORDER — POTASSIUM CHLORIDE 10 MEQ/100ML IV SOLN
10.0000 meq | INTRAVENOUS | Status: AC
  Administered 2023-03-09 (×5): 10 meq via INTRAVENOUS
  Filled 2023-03-09 (×6): qty 100

## 2023-03-09 MED ORDER — FENTANYL CITRATE (PF) 250 MCG/5ML IJ SOLN
INTRAMUSCULAR | Status: DC | PRN
  Administered 2023-03-09 (×2): 100 ug via INTRAVENOUS

## 2023-03-09 MED ORDER — CHLORHEXIDINE GLUCONATE 4 % EX SOLN
60.0000 mL | Freq: Once | CUTANEOUS | Status: DC
  Filled 2023-03-09: qty 60

## 2023-03-09 MED ORDER — ONDANSETRON HCL 4 MG/2ML IJ SOLN
INTRAMUSCULAR | Status: DC | PRN
  Administered 2023-03-09: 4 mg via INTRAVENOUS

## 2023-03-09 MED ORDER — CHLORHEXIDINE GLUCONATE 0.12 % MT SOLN
OROMUCOSAL | Status: AC
  Administered 2023-03-09: 15 mL via OROMUCOSAL
  Filled 2023-03-09: qty 15

## 2023-03-09 MED ORDER — ROCURONIUM BROMIDE 10 MG/ML (PF) SYRINGE
PREFILLED_SYRINGE | INTRAVENOUS | Status: DC | PRN
  Administered 2023-03-09: 50 mg via INTRAVENOUS

## 2023-03-09 MED ORDER — VANCOMYCIN HCL 1000 MG IV SOLR
INTRAVENOUS | Status: DC | PRN
  Administered 2023-03-09: 1000 mg via TOPICAL

## 2023-03-09 MED ORDER — HYDRALAZINE HCL 20 MG/ML IJ SOLN
INTRAMUSCULAR | Status: AC
  Filled 2023-03-09: qty 1

## 2023-03-09 MED ORDER — POTASSIUM CHLORIDE 10 MEQ/100ML IV SOLN
INTRAVENOUS | Status: AC
  Administered 2023-03-09: 10 meq via INTRAVENOUS
  Filled 2023-03-09: qty 100

## 2023-03-09 MED ORDER — SUGAMMADEX SODIUM 200 MG/2ML IV SOLN
INTRAVENOUS | Status: DC | PRN
  Administered 2023-03-09: 200 mg via INTRAVENOUS

## 2023-03-09 MED ORDER — KETAMINE HCL 50 MG/5ML IJ SOSY
PREFILLED_SYRINGE | INTRAMUSCULAR | Status: AC
  Filled 2023-03-09: qty 5

## 2023-03-09 MED ORDER — ONDANSETRON HCL 4 MG/2ML IJ SOLN
INTRAMUSCULAR | Status: AC
  Filled 2023-03-09: qty 2

## 2023-03-09 MED ORDER — HYDROMORPHONE HCL 1 MG/ML IJ SOLN
0.2500 mg | INTRAMUSCULAR | Status: DC | PRN
  Administered 2023-03-09: 0.25 mg via INTRAVENOUS

## 2023-03-09 MED ORDER — VANCOMYCIN HCL 1000 MG IV SOLR
INTRAVENOUS | Status: AC
  Filled 2023-03-09: qty 20

## 2023-03-09 MED ORDER — PROPOFOL 10 MG/ML IV BOLUS
INTRAVENOUS | Status: DC | PRN
  Administered 2023-03-09: 150 mg via INTRAVENOUS

## 2023-03-09 MED ORDER — ACETAMINOPHEN 10 MG/ML IV SOLN
INTRAVENOUS | Status: AC
  Filled 2023-03-09: qty 100

## 2023-03-09 MED ORDER — LIDOCAINE 2% (20 MG/ML) 5 ML SYRINGE
INTRAMUSCULAR | Status: DC | PRN
  Administered 2023-03-09: 100 mg via INTRAVENOUS

## 2023-03-09 MED ORDER — POVIDONE-IODINE 10 % EX SWAB
2.0000 | Freq: Once | CUTANEOUS | Status: AC
  Administered 2023-03-09: 2 via TOPICAL

## 2023-03-09 MED ORDER — OXYCODONE HCL 5 MG PO TABS
5.0000 mg | ORAL_TABLET | ORAL | Status: DC | PRN
  Administered 2023-03-09 – 2023-03-11 (×6): 10 mg via ORAL
  Administered 2023-03-11: 5 mg via ORAL
  Administered 2023-03-12 (×2): 10 mg via ORAL
  Filled 2023-03-09 (×12): qty 2

## 2023-03-09 MED ORDER — KETOROLAC TROMETHAMINE 30 MG/ML IJ SOLN: 30.0000 mg | Freq: Once | INTRAMUSCULAR | Status: DC | PRN

## 2023-03-09 MED ORDER — CEFAZOLIN SODIUM-DEXTROSE 2-4 GM/100ML-% IV SOLN
2.0000 g | INTRAVENOUS | Status: AC
Start: 2023-03-09 — End: 2023-03-09
  Administered 2023-03-09: 2 g via INTRAVENOUS
  Filled 2023-03-09: qty 100

## 2023-03-09 MED ORDER — CHLORHEXIDINE GLUCONATE 0.12 % MT SOLN
15.0000 mL | Freq: Once | OROMUCOSAL | Status: AC
Start: 2023-03-09 — End: 2023-03-09

## 2023-03-09 MED ORDER — DEXMEDETOMIDINE HCL IN NACL 80 MCG/20ML IV SOLN
INTRAVENOUS | Status: DC | PRN
Start: 2023-03-09 — End: 2023-03-09
  Administered 2023-03-09: 10 ug via INTRAVENOUS

## 2023-03-09 SURGICAL SUPPLY — 52 items
ADH SKN CLS APL DERMABOND .7 (GAUZE/BANDAGES/DRESSINGS) ×1
APL PRP STRL LF DISP 70% ISPRP (MISCELLANEOUS) ×1
BAG COUNTER SPONGE SURGICOUNT (BAG) ×1 IMPLANT
BAG SPNG CNTER NS LX DISP (BAG) ×1
BNDG COHESIVE 4X5 TAN STRL (GAUZE/BANDAGES/DRESSINGS) ×1 IMPLANT
BNDG GAUZE DERMACEA FLUFF 4 (GAUZE/BANDAGES/DRESSINGS) ×2 IMPLANT
BNDG GZE DERMACEA 4 6PLY (GAUZE/BANDAGES/DRESSINGS)
BRUSH SCRUB EZ PLAIN DRY (MISCELLANEOUS) ×2 IMPLANT
CHLORAPREP W/TINT 26 (MISCELLANEOUS) ×1 IMPLANT
COVER MAYO STAND STRL (DRAPES) ×1 IMPLANT
COVER SURGICAL LIGHT HANDLE (MISCELLANEOUS) ×2 IMPLANT
DERMABOND ADVANCED .7 DNX12 (GAUZE/BANDAGES/DRESSINGS) IMPLANT
DRAPE ORTHO SPLIT 77X108 STRL (DRAPES) ×1
DRAPE SURG 17X23 STRL (DRAPES) ×1 IMPLANT
DRAPE SURG ORHT 6 SPLT 77X108 (DRAPES) ×1 IMPLANT
DRAPE U-SHAPE 47X51 STRL (DRAPES) ×1 IMPLANT
DRSG ADAPTIC 3X8 NADH LF (GAUZE/BANDAGES/DRESSINGS) ×1 IMPLANT
DRSG MEPILEX POST OP 4X8 (GAUZE/BANDAGES/DRESSINGS) IMPLANT
ELECT REM PT RETURN 9FT ADLT (ELECTROSURGICAL)
ELECTRODE REM PT RTRN 9FT ADLT (ELECTROSURGICAL) IMPLANT
EVACUATOR 1/8 PVC DRAIN (DRAIN) IMPLANT
GAUZE SPONGE 4X4 12PLY STRL (GAUZE/BANDAGES/DRESSINGS) ×1 IMPLANT
GLOVE BIO SURGEON STRL SZ 6.5 (GLOVE) ×3 IMPLANT
GLOVE BIO SURGEON STRL SZ7.5 (GLOVE) ×4 IMPLANT
GLOVE BIOGEL PI IND STRL 6.5 (GLOVE) ×1 IMPLANT
GLOVE BIOGEL PI IND STRL 7.5 (GLOVE) ×1 IMPLANT
GOWN STRL REUS W/ TWL LRG LVL3 (GOWN DISPOSABLE) ×2 IMPLANT
GOWN STRL REUS W/TWL LRG LVL3 (GOWN DISPOSABLE) ×2
HANDPIECE INTERPULSE COAX TIP (DISPOSABLE)
KIT BASIN OR (CUSTOM PROCEDURE TRAY) ×1 IMPLANT
KIT TURNOVER KIT B (KITS) ×1 IMPLANT
MANIFOLD NEPTUNE II (INSTRUMENTS) ×1 IMPLANT
NS IRRIG 1000ML POUR BTL (IV SOLUTION) ×1 IMPLANT
PACK ORTHO EXTREMITY (CUSTOM PROCEDURE TRAY) ×1 IMPLANT
PAD ARMBOARD 7.5X6 YLW CONV (MISCELLANEOUS) ×2 IMPLANT
PADDING CAST COTTON 6X4 STRL (CAST SUPPLIES) ×1 IMPLANT
SET HNDPC FAN SPRY TIP SCT (DISPOSABLE) IMPLANT
SPONGE T-LAP 18X18 ~~LOC~~+RFID (SPONGE) ×1 IMPLANT
SUT ETHILON 2 0 FS 18 (SUTURE) ×2 IMPLANT
SUT ETHILON 3 0 PS 1 (SUTURE) ×2 IMPLANT
SUT MNCRL AB 3-0 PS2 27 (SUTURE) IMPLANT
SUT MON AB 2-0 CT1 36 (SUTURE) ×1 IMPLANT
SUT PDS AB 0 CT 36 (SUTURE) IMPLANT
SUT VIC AB 0 CT1 27 (SUTURE) ×2
SUT VIC AB 0 CT1 27XBRD ANBCTR (SUTURE) IMPLANT
SWAB CULTURE ESWAB REG 1ML (MISCELLANEOUS) IMPLANT
TOWEL GREEN STERILE (TOWEL DISPOSABLE) ×2 IMPLANT
TOWEL GREEN STERILE FF (TOWEL DISPOSABLE) ×1 IMPLANT
TUBE CONNECTING 12X1/4 (SUCTIONS) ×1 IMPLANT
UNDERPAD 30X36 HEAVY ABSORB (UNDERPADS AND DIAPERS) ×1 IMPLANT
WATER STERILE IRR 1000ML POUR (IV SOLUTION) ×1 IMPLANT
YANKAUER SUCT BULB TIP NO VENT (SUCTIONS) ×1 IMPLANT
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
## 2023-03-09 NOTE — Progress Notes (Signed)
PT Cancellation Note  Patient Details Name: [name] MRN: [id number] DOB: Feb 08, 1986   Cancelled Treatment:    Reason Eval/Treat Not Completed: (P) Patient at procedure or test/unavailable. Will plan to follow-up later as time permits.   Virgil Benedict, PT, DPT Acute Rehabilitation Services  Office: [phone]    Bettina Gavia 03/09/2023, 10:36 AM
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
## 2023-03-09 NOTE — Plan of Care (Signed)

## 2023-03-09 NOTE — Progress Notes (Signed)
Pt transported off unit to OR. Report given to short stay. Pt alert and reports informed consent not signed due to questions for surgeon. Pt belongings remain in room, and family member remains in the room.

## 2023-03-09 NOTE — Anesthesia Preprocedure Evaluation (Signed)
Anesthesia Evaluation  Patient identified by MRN, date of birth, ID band Patient awake    Reviewed: Allergy & Precautions, H&P , NPO status , Patient's Chart, lab work & pertinent test results  Airway Mallampati: II  TM Distance: >3 FB Neck ROM: Full    Dental no notable dental hx.    Pulmonary neg pulmonary ROS   Pulmonary exam normal breath sounds clear to auscultation       Cardiovascular negative cardio ROS Normal cardiovascular exam Rhythm:Regular Rate:Normal     Neuro/Psych negative neurological ROS  negative psych ROS   GI/Hepatic negative GI ROS, Neg liver ROS,,,  Endo/Other  negative endocrine ROS    Renal/GU negative Renal ROS  negative genitourinary   Musculoskeletal negative musculoskeletal ROS (+)    Abdominal   Peds negative pediatric ROS (+)  Hematology negative hematology ROS (+)   Anesthesia Other Findings   Reproductive/Obstetrics negative OB ROS                             Anesthesia Physical Anesthesia Plan  ASA: 2  Anesthesia Plan: General   Post-op Pain Management:    Induction: Intravenous  PONV Risk Score and Plan: 2 and Ondansetron, Dexamethasone and Treatment may vary due to age or medical condition  Airway Management Planned: Oral ETT  Additional Equipment:   Intra-op Plan:   Post-operative Plan: Extubation in OR  Informed Consent: I have reviewed the patients History and Physical, chart, labs and discussed the procedure including the risks, benefits and alternatives for the proposed anesthesia with the patient or authorized representative who has indicated his/her understanding and acceptance.     Dental advisory given  Plan Discussed with: CRNA and Surgeon  Anesthesia Plan Comments:        Anesthesia Quick Evaluation
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
## 2023-03-09 NOTE — Anesthesia Postprocedure Evaluation (Signed)
Anesthesia Post Note  Patient: Jamie Lowe  Procedure(s) Performed: IRRIGATION AND DEBRIDEMENT FOREIGN BODY REMOVAL OF HIP (Left: Hip)     Patient location during evaluation: PACU Anesthesia Type: General Level of consciousness: awake and alert Pain management: pain level controlled Vital Signs Assessment: post-procedure vital signs reviewed and stable Respiratory status: spontaneous breathing, nonlabored ventilation, respiratory function stable and patient connected to nasal cannula oxygen Cardiovascular status: blood pressure returned to baseline and stable Postop Assessment: no apparent nausea or vomiting Anesthetic complications: no  No notable events documented.  Last Vitals:  Vitals:   03/09/23 1030 03/09/23 1045  BP: (!) 157/112 (!) 162/103  Pulse: 93 90  Resp: 20 12  Temp:    SpO2: 97% 97%    Last Pain:  Vitals:   03/09/23 1030  TempSrc:   PainSc: 10-Worst pain ever                 Geovani Tootle S

## 2023-03-09 NOTE — Progress Notes (Signed)
OT Cancellation Note  Patient Details Name: Jamie Lowe MRN: [id number] DOB: May 21, 1985   Cancelled Treatment:    Reason Eval/Treat Not Completed: Patient at procedure or test/ unavailable Patient off the floor surgery to L femur. OT will follow back when medically appropriate.   Pollyann Glen E. Navdeep Fessenden, OTR/L Acute Rehabilitation Services [phone]   Cherlyn Cushing 03/09/2023, 8:11 AM

## 2023-03-09 NOTE — Anesthesia Procedure Notes (Signed)
Procedure Name: Intubation Date/Time: 03/09/2023 8:59 AM  Performed by: Camillia Herter, CRNAPre-anesthesia Checklist: Patient identified, Emergency Drugs available, Suction available and Patient being monitored Patient Re-evaluated:Patient Re-evaluated prior to induction Oxygen Delivery Method: Circle System Utilized Preoxygenation: Pre-oxygenation with 100% oxygen Induction Type: IV induction, Rapid sequence and Cricoid Pressure applied Ventilation: Mask ventilation without difficulty Laryngoscope Size: Miller and 2 Grade View: Grade II Tube type: Oral Tube size: 7.5 mm Number of attempts: 1 Airway Equipment and Method: Stylet Placement Confirmation: ETT inserted through vocal cords under direct vision, positive ETCO2 and breath sounds checked- equal and bilateral Secured at: 23 cm Tube secured with: Tape Dental Injury: Teeth and Oropharynx as per pre-operative assessment  Comments: Preexisting NGT to suction prior to Induction.
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
## 2023-03-09 NOTE — Plan of Care (Signed)

## 2023-03-09 NOTE — Transfer of Care (Signed)
Immediate Anesthesia Transfer of Care Note  Patient: Jamie Lowe  Procedure(s) Performed: IRRIGATION AND DEBRIDEMENT FOREIGN BODY REMOVAL OF HIP (Left: Hip)  Patient Location: PACU  Anesthesia Type:General  Level of Consciousness: awake, alert , and oriented  Airway & Oxygen Therapy: Patient Spontanous Breathing and Patient connected to nasal cannula oxygen  Post-op Assessment: Report given to RN and Post -op Vital signs reviewed and stable  Post vital signs: Reviewed and stable  Last Vitals:  Vitals Value Taken Time  BP 179/121 03/09/23 1007  Temp 36.8 C 03/09/23 1000  Pulse 99 03/09/23 1008  Resp 13 03/09/23 1008  SpO2 98 % 03/09/23 1008  Vitals shown include unfiled device data.  Last Pain:  Vitals:   03/09/23 0815  TempSrc:   PainSc: 0-No pain      Patients Stated Pain Goal: 0 (03/08/23 0455)  Complications: No notable events documented.

## 2023-03-10 ENCOUNTER — Encounter (HOSPITAL_COMMUNITY): Payer: Self-pay | Admitting: Student

## 2023-03-10 LAB — BASIC METABOLIC PANEL
Anion gap: 13 (ref 5–15)
BUN: 12 mg/dL (ref 6–20)
CO2: 23 mmol/L (ref 22–32)
Calcium: 9 mg/dL (ref 8.9–10.3)
Chloride: 99 mmol/L (ref 98–111)
Creatinine, Ser: 1.01 mg/dL (ref 0.61–1.24)
GFR, Estimated: >60 mL/min (ref >60)
Glucose, Bld: 110 mg/dL — ABNORMAL HIGH (ref 70–99)
Potassium: 3 mmol/L — ABNORMAL LOW (ref 3.5–5.1)
Sodium: 135 mmol/L (ref 135–145)

## 2023-03-10 LAB — CBC
HCT: 31.3 % — ABNORMAL LOW (ref 39.0–52.0)
Hemoglobin: 11.3 g/dL — ABNORMAL LOW (ref 13.0–17.0)
MCH: 34.3 pg — ABNORMAL HIGH (ref 26.0–34.0)
MCHC: 36.1 g/dL — ABNORMAL HIGH (ref 30.0–36.0)
MCV: 95.1 fL (ref 80.0–100.0)
Platelets: 259 10*3/uL (ref 150–400)
RBC: 3.29 MIL/uL — ABNORMAL LOW (ref 4.22–5.81)
RDW: 15.6 % — ABNORMAL HIGH (ref 11.5–15.5)
WBC: 11.1 10*3/uL — ABNORMAL HIGH (ref 4.0–10.5)
nRBC: 0 % (ref 0.0–0.2)

## 2023-03-10 LAB — MAGNESIUM: Magnesium: 1.8 mg/dL (ref 1.7–2.4)

## 2023-03-10 LAB — VITAMIN D 25 HYDROXY (VIT D DEFICIENCY, FRACTURES): Vit D, 25-Hydroxy: 7.55 ng/mL — ABNORMAL LOW (ref 30–100)

## 2023-03-10 LAB — PHOSPHORUS: Phosphorus: 3.5 mg/dL (ref 2.5–4.6)

## 2023-03-10 MED ORDER — MELATONIN 3 MG PO TABS
3.0000 mg | ORAL_TABLET | Freq: Every day | ORAL | Status: DC
  Administered 2023-03-10 – 2023-03-11 (×2): 3 mg via ORAL
  Filled 2023-03-10 (×2): qty 1

## 2023-03-10 MED ORDER — POTASSIUM CHLORIDE 20 MEQ PO PACK
40.0000 meq | PACK | Freq: Two times a day (BID) | ORAL | Status: AC
  Administered 2023-03-10: 40 meq via ORAL
  Filled 2023-03-10: qty 2

## 2023-03-10 MED ORDER — ENOXAPARIN SODIUM 40 MG/0.4ML IJ SOSY
40.0000 mg | PREFILLED_SYRINGE | INTRAMUSCULAR | Status: DC
  Administered 2023-03-10 – 2023-03-11 (×2): 40 mg via SUBCUTANEOUS
  Filled 2023-03-10 (×2): qty 0.4

## 2023-03-10 MED ORDER — POTASSIUM CHLORIDE 20 MEQ PO PACK
40.0000 meq | PACK | Freq: Two times a day (BID) | ORAL | Status: DC
  Administered 2023-03-10: 40 meq
  Filled 2023-03-10: qty 2
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
## 2023-03-10 NOTE — Plan of Care (Signed)
  Problem: Activity: Goal: Risk for activity intolerance will decrease Outcome: Progressing   Problem: Coping: Goal: Level of anxiety will decrease Outcome: Progressing   Problem: Safety: Goal: Ability to remain free from injury will improve Outcome: Progressing   Problem: Skin Integrity: Goal: Risk for impaired skin integrity will decrease Outcome: Progressing   
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
## 2023-03-10 NOTE — Progress Notes (Signed)
Pt calling out for IV pain medication, and specifically asking for Dilaudid. This RN educated pt on pain management medications, and oxycodone was offered. Pt refuses to take oxycodone. Pt also refusing to take scheduled PO medications stating "it will make me throw up" in a loud boisterous voice. MD on unit made aware.

## 2023-03-10 NOTE — Plan of Care (Signed)
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
## 2023-03-11 LAB — BASIC METABOLIC PANEL WITH GFR
Anion gap: 13 (ref 5–15)
BUN: 10 mg/dL (ref 6–20)
CO2: 25 mmol/L (ref 22–32)
Calcium: 9.5 mg/dL (ref 8.9–10.3)
Chloride: 97 mmol/L — ABNORMAL LOW (ref 98–111)
Creatinine, Ser: 0.98 mg/dL (ref 0.61–1.24)
GFR, Estimated: >60 mL/min
Glucose, Bld: 110 mg/dL — ABNORMAL HIGH (ref 70–99)
Potassium: 3.6 mmol/L (ref 3.5–5.1)
Sodium: 135 mmol/L (ref 135–145)

## 2023-03-11 LAB — CBC
HCT: 33.3 % — ABNORMAL LOW (ref 39.0–52.0)
Hemoglobin: 12 g/dL — ABNORMAL LOW (ref 13.0–17.0)
MCH: 34.6 pg — ABNORMAL HIGH (ref 26.0–34.0)
MCHC: 36 g/dL (ref 30.0–36.0)
MCV: 96 fL (ref 80.0–100.0)
Platelets: 323 K/uL (ref 150–400)
RBC: 3.47 MIL/uL — ABNORMAL LOW (ref 4.22–5.81)
RDW: 15.6 % — ABNORMAL HIGH (ref 11.5–15.5)
WBC: 13.1 K/uL — ABNORMAL HIGH (ref 4.0–10.5)
nRBC: 0 % (ref 0.0–0.2)

## 2023-03-11 LAB — PHOSPHORUS: Phosphorus: 4.2 mg/dL (ref 2.5–4.6)

## 2023-03-11 LAB — MAGNESIUM: Magnesium: 2 mg/dL (ref 1.7–2.4)

## 2023-03-11 MED ORDER — HYDROMORPHONE HCL 1 MG/ML IJ SOLN: 1.0000 mg | INTRAMUSCULAR | Status: DC | PRN

## 2023-03-11 MED ORDER — HYDROMORPHONE HCL 1 MG/ML IJ SOLN: 0.2500 mg | Freq: Three times a day (TID) | INTRAMUSCULAR | Status: DC | PRN

## 2023-03-11 MED ORDER — HYDROMORPHONE HCL 1 MG/ML IJ SOLN: 0.5000 mg | INTRAMUSCULAR | Status: DC | PRN

## 2023-03-11 MED ORDER — VITAMIN D (ERGOCALCIFEROL) 1.25 MG (50000 UNIT) PO CAPS
50000.0000 [IU] | ORAL_CAPSULE | ORAL | Status: DC
  Administered 2023-03-11: 50000 [IU] via ORAL
  Filled 2023-03-11: qty 1
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
## 2023-03-11 NOTE — Discharge Instructions (Addendum)
Orthopaedic Trauma Service Discharge Instructions   General Discharge Instructions  WEIGHT BEARING STATUS: Weightbearing as tolerated left lower extremity  RANGE OF MOTION/ACTIVITY: Okay for unrestricted range of motion of the left hip and knee  Wound Care: You may remove your surgical dressing on Saturday, 03/12/2023.  Incisions can be left open to air if there is no drainage. Once the incision is completely dry and without drainage, it may be left open to air out.  Showering may begin as early as postoperative day #3, (Saturday, 03/12/2023).  Clean incision gently with soap and water.  DVT/PE prophylaxis: Aspirin 81 mg daily x 30 days  Diet: as you were eating previously.  Can use over the counter stool softeners and bowel preparations, such as Miralax, to help with bowel movements.  Narcotics can be constipating.  Be sure to drink plenty of fluids  PAIN MEDICATION USE AND EXPECTATIONS  You have likely been given narcotic medications to help control your pain.  After a traumatic event that results in an fracture (broken bone) with or without surgery, it is ok to use narcotic pain medications to help control one's pain.  We understand that everyone responds to pain differently and each individual patient will be evaluated on a regular basis for the continued need for narcotic medications. Ideally, narcotic medication use should last no more than 6-8 weeks (coinciding with fracture healing).   As a patient it is your responsibility as well to monitor narcotic medication use and report the amount and frequency you use these medications when you come to your office visit.   We would also advise that if you are using narcotic medications, you should take a dose prior to therapy to maximize you participation.  IF YOU ARE ON NARCOTIC MEDICATIONS IT IS NOT PERMISSIBLE TO OPERATE A MOTOR VEHICLE (MOTORCYCLE/CAR/TRUCK/MOPED) OR HEAVY MACHINERY DO NOT MIX NARCOTICS WITH OTHER CNS (CENTRAL NERVOUS  SYSTEM) DEPRESSANTS SUCH AS ALCOHOL   STOP SMOKING OR USING NICOTINE PRODUCTS!!!!  As discussed nicotine severely impairs your body's ability to heal surgical and traumatic wounds but also impairs bone healing.  Wounds and bone heal by forming microscopic blood vessels (angiogenesis) and nicotine is a vasoconstrictor (essentially, shrinks blood vessels).  Therefore, if vasoconstriction occurs to these microscopic blood vessels they essentially disappear and are unable to deliver necessary nutrients to the healing tissue.  This is one modifiable factor that you can do to dramatically increase your chances of healing your injury.    (This means no smoking, no nicotine gum, patches, etc)  DO NOT USE NONSTEROIDAL ANTI-INFLAMMATORY DRUGS (NSAID'S)  Using products such as Advil (ibuprofen), Aleve (naproxen), Motrin (ibuprofen) for additional pain control during fracture healing can delay and/or prevent the healing response.  If you would like to take over the counter (OTC) medication, Tylenol (acetaminophen) is ok.  However, some narcotic medications that are given for pain control contain acetaminophen as well. Therefore, you should not exceed more than 4000 mg of tylenol in a day if you do not have liver disease.  Also note that there are may OTC medicines, such as cold medicines and allergy medicines that my contain tylenol as well.  If you have any questions about medications and/or interactions please ask your doctor/PA or your pharmacist.      ICE AND ELEVATE INJURED/OPERATIVE EXTREMITY  Using ice and elevating the injured extremity above your heart can help with swelling and pain control.  Icing in a pulsatile fashion, such as 20 minutes on and 20 minutes  off, can be followed.    Do not place ice directly on skin. Make sure there is a barrier between to skin and the ice pack.    Using frozen items such as frozen peas works well as the conform nicely to the are that needs to be iced.  USE AN ACE WRAP  OR TED HOSE FOR SWELLING CONTROL  In addition to icing and elevation, Ace wraps or TED hose are used to help limit and resolve swelling.  It is recommended to use Ace wraps or TED hose until you are informed to stop.    When using Ace Wraps start the wrapping distally (farthest away from the body) and wrap proximally (closer to the body)   Example: If you had surgery on your leg or thing and you do not have a splint on, start the ace wrap at the toes and work your way up to the thigh        If you had surgery on your upper extremity and do not have a splint on, start the ace wrap at your fingers and work your way up to the upper arm   CALL THE OFFICE WITH ANY QUESTIONS OR CONCERNS: [phone]   VISIT OUR WEBSITE FOR ADDITIONAL INFORMATION: [email or web address]    Discharge Wound Care Instructions  Do NOT apply any ointments, solutions or lotions to pin sites or surgical wounds.  These prevent needed drainage and even though solutions like hydrogen peroxide kill bacteria, they also damage cells lining the pin sites that help fight infection.  Applying lotions or ointments can keep the wounds moist and can cause them to breakdown and open up as well. This can increase the risk for infection. When in doubt call the office.  If any drainage is noted, use foam dressing (Mepilex) - These dressing supplies should be available at local medical supply stores [facility], [facility], etc) as well as Insurance claims handler ([facility], [facility], [employer], etc)  Once the incision is completely dry and without drainage, it may be left open to air out.  Showering may begin 36-48 hours later.  Cleaning gently with soap and water.  Traumatic wounds should be dressed daily as well.    One layer of adaptic, gauze, Kerlix, then ace wrap.  The adaptic can be discontinued once the draining has ceased    If you have a wet to dry dressing: wet the gauze with saline the squeeze as much saline out so the gauze is moist  (not soaking wet), place moistened gauze over wound, then place a dry gauze over the moist one, followed by Kerlix wrap, then ace wrap.   [facility]      [facility], [place] [phone]  OPEN ABDOMINAL SURGERY: POST OP INSTRUCTIONS  Always review your discharge instruction sheet given to you by the facility where your surgery was performed.  IF YOU HAVE DISABILITY OR FAMILY LEAVE FORMS, YOU MUST BRING THEM TO THE OFFICE FOR PROCESSING.  PLEASE DO NOT GIVE THEM TO YOUR DOCTOR.  A prescription for pain medication may be given to you upon discharge.  Take your pain medication as prescribed, if needed.  If narcotic pain medicine is not needed, then you may take acetaminophen (Tylenol) or ibuprofen (Advil) as needed. Take your usually prescribed medications unless otherwise directed. If you need a refill on your pain medication, please contact your pharmacy. They will contact our office to request authorization.  Prescriptions will not be filled after 5pm or on week-ends. You should follow a  light diet the first few days after arrival home, such as soup and crackers, pudding, etc.unless your doctor has advised otherwise. A high-fiber, low fat diet can be resumed as tolerated.   Be sure to include lots of fluids daily. Most patients will experience some swelling and bruising on the chest and neck area.  Ice packs will help.  Swelling and bruising can take several days to resolve Most patients will experience some swelling and bruising in the area of the incision. Ice pack will help. Swelling and bruising can take several days to resolve..  It is common to experience some constipation if taking pain medication after surgery.  Increasing fluid intake and taking a stool softener will usually help or prevent this problem from occurring.  A mild laxative (Milk of Magnesia or Miralax) should be taken according to package directions if there are no bowel movements after 48 hours.  You may have steri-strips  (small skin tapes) in place directly over the incision.  These strips should be left on the skin for 7-10 days.  If your surgeon used skin glue on the incision, you may shower in 24 hours.  The glue will flake off over the next 2-3 weeks.  Any sutures or staples will be removed at the office during your follow-up visit. You may find that a light gauze bandage over your incision may keep your staples from being rubbed or pulled. You may shower and replace the bandage daily. ACTIVITIES:  You may resume regular (light) daily activities beginning the next day--such as daily self-care, walking, climbing stairs--gradually increasing activities as tolerated.  You may have sexual intercourse when it is comfortable.  Refrain from any heavy lifting or straining until approved by your doctor. You may drive when you no longer are taking prescription pain medication, you can comfortably wear a seatbelt, and you can safely maneuver your car and apply brakes Return to Work: ___________________________________ Bonita Quin should see your doctor in the office for a follow-up appointment approximately two weeks after your surgery.  Make sure that you call for this appointment within a day or two after you arrive home to insure a convenient appointment time. OTHER INSTRUCTIONS:  _____________________________________________________________ _____________________________________________________________  WHEN TO CALL YOUR DOCTOR: Fever over 101.0 Inability to urinate Nausea and/or vomiting Extreme swelling or bruising Continued bleeding from incision. Increased pain, redness, or drainage from the incision. Difficulty swallowing or breathing Muscle cramping or spasms. Numbness or tingling in hands or feet or around lips.  The clinic staff is available to answer your questions during regular business hours.  Please don't hesitate to call and ask to speak to one of the nurses if you have concerns.  For further questions,  please visit [email or web address]   MIDLINE WOUND CARE: - midline dressing to be changed twice daily - supplies: sterile saline, kerlix, scissors, ABD pads, tape  - remove dressing and all packing carefully, moistening with sterile saline as needed to avoid packing/internal dressing sticking to the wound. - clean edges of skin around the wound with water/gauze, making sure there is no tape debris or leakage left on skin that could cause skin irritation or breakdown. - dampen and clean kerlix with sterile saline and pack wound from wound base to skin level, making sure to take note of any possible areas of wound tracking, tunneling and packing appropriately. Wound can be packed loosely. Trim kerlix to size if a whole kerlix is not required. - cover wound with a dry ABD pad and secure with tape.  -  write the date/time on the dry dressing/tape to better track when the last dressing change occurred. - apply any skin protectant/powder recommended by clinician to protect skin/skin folds. - change dressing as needed if leakage occurs, wound gets contaminated, or to shower. - may shower daily with wound open and following the shower the wound should be dried and a clean dressing placed.

## 2023-03-11 NOTE — Progress Notes (Signed)
Pts significant other, Danny Lawless, educated on how to perform abdominal dressing change and signs and symptoms of infection, and when to seek medical care. Pt's significant other did a hands on wound dressing change with RN, she voiced understanding, denies any questions at this time.
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
## 2023-03-11 NOTE — Progress Notes (Signed)
Patient ambulated to the bathroom by himself and refused help. Patient requested pain medication IV and educated on the importance of not receiving IV pain medication tonight since he is suppose to be discharged tomorrow. Patient is aware he can have PO pain medication tonight around 2200. Patient is back in bed on the monitor at this time.
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
## 2023-03-11 NOTE — Plan of Care (Signed)

## 2023-03-11 NOTE — Plan of Care (Signed)
  Problem: Education: Goal: Knowledge of General Education information will improve Description: Including pain rating scale, medication(s)/side effects and non-pharmacologic comfort measures Outcome: Progressing   Problem: Activity: Goal: Risk for activity intolerance will decrease Outcome: Progressing   Problem: Coping: Goal: Level of anxiety will decrease Outcome: Progressing   

## 2023-03-12 LAB — BASIC METABOLIC PANEL
Anion gap: 11 (ref 5–15)
BUN: 10 mg/dL (ref 6–20)
CO2: 25 mmol/L (ref 22–32)
Calcium: 9.3 mg/dL (ref 8.9–10.3)
Chloride: 96 mmol/L — ABNORMAL LOW (ref 98–111)
Creatinine, Ser: 0.91 mg/dL (ref 0.61–1.24)
GFR, Estimated: >60 mL/min (ref >60)
Glucose, Bld: 108 mg/dL — ABNORMAL HIGH (ref 70–99)
Potassium: 3.5 mmol/L (ref 3.5–5.1)
Sodium: 132 mmol/L — ABNORMAL LOW (ref 135–145)

## 2023-03-12 LAB — CBC
HCT: 32.6 % — ABNORMAL LOW (ref 39.0–52.0)
Hemoglobin: 11.8 g/dL — ABNORMAL LOW (ref 13.0–17.0)
MCH: 34 pg (ref 26.0–34.0)
MCHC: 36.2 g/dL — ABNORMAL HIGH (ref 30.0–36.0)
MCV: 93.9 fL (ref 80.0–100.0)
Platelets: 327 10*3/uL (ref 150–400)
RBC: 3.47 MIL/uL — ABNORMAL LOW (ref 4.22–5.81)
RDW: 15.4 % (ref 11.5–15.5)
WBC: 10.3 10*3/uL (ref 4.0–10.5)
nRBC: 0 % (ref 0.0–0.2)

## 2023-03-12 LAB — MAGNESIUM: Magnesium: 1.9 mg/dL (ref 1.7–2.4)

## 2023-03-12 LAB — PHOSPHORUS: Phosphorus: 3.9 mg/dL (ref 2.5–4.6)

## 2023-03-12 MED ORDER — METHOCARBAMOL 1000 MG PO TABS: 1000.0000 mg | ORAL_TABLET | Freq: Four times a day (QID) | ORAL | 0 refills | Status: AC

## 2023-03-12 MED ORDER — DOCUSATE SODIUM 100 MG PO CAPS: 100.0000 mg | ORAL_CAPSULE | Freq: Two times a day (BID) | ORAL | Status: AC | PRN

## 2023-03-12 MED ORDER — ACETAMINOPHEN 500 MG PO TABS: 1000.0000 mg | ORAL_TABLET | Freq: Four times a day (QID) | ORAL | Status: AC | PRN

## 2023-03-12 MED ORDER — VITAMIN D (ERGOCALCIFEROL) 1.25 MG (50000 UNIT) PO CAPS: 50000.0000 [IU] | ORAL_CAPSULE | ORAL | 0 refills | Status: AC

## 2023-03-12 MED ORDER — ASPIRIN 81 MG PO TBEC: 81.0000 mg | DELAYED_RELEASE_TABLET | Freq: Every day | ORAL | 0 refills | Status: AC

## 2023-03-12 MED ORDER — POLYETHYLENE GLYCOL 3350 17 G PO PACK: 17.0000 g | PACK | Freq: Every day | ORAL | Status: AC | PRN

## 2023-03-12 MED ORDER — OXYCODONE HCL 5 MG PO TABS: 5.0000 mg | ORAL_TABLET | ORAL | 0 refills | Status: AC | PRN
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
## 2023-03-12 NOTE — Progress Notes (Signed)
Patient eager to get out of the hospital. Last BP was 133/87 (taken before discharge by request of Johnny Bridge PA). Midline IV was taken out, catheter tip intact. Went over discharge instructions including meds, pharmacy, & follow-up appts with patient and his girlfriend. Patient given extra wound care supplies for his abdominal incision & a walker to go home with. Patient denies any questions, wheeled down to his girlfriend's car in a wheelchair and safely transferred to the front passenger seat

## 2023-03-13 ENCOUNTER — Other Ambulatory Visit: Payer: Self-pay | Admitting: Surgery

## 2023-03-13 MED ORDER — METHOCARBAMOL 500 MG PO TABS: 1000.0000 mg | ORAL_TABLET | Freq: Three times a day (TID) | ORAL | Status: AC

## 2023-03-14 ENCOUNTER — Encounter (HOSPITAL_COMMUNITY): Payer: Self-pay

## 2023-09-22 ENCOUNTER — Encounter (HOSPITAL_COMMUNITY): Payer: Self-pay

## 2023-09-22 ENCOUNTER — Emergency Department (HOSPITAL_COMMUNITY)

## 2023-09-22 ENCOUNTER — Emergency Department (HOSPITAL_COMMUNITY)
Admission: EM | Admit: 2023-09-22 | Discharge: 2023-09-22 | Disposition: A | Discharge status: Home / Self Care | Attending: Emergency Medicine | Admitting: Emergency Medicine

## 2023-09-22 ENCOUNTER — Other Ambulatory Visit: Payer: Self-pay

## 2023-09-22 DIAGNOSIS — F17200 Nicotine dependence, unspecified, uncomplicated: Secondary | ICD-10-CM | POA: Diagnosis not present

## 2023-09-22 DIAGNOSIS — R030 Elevated blood-pressure reading, without diagnosis of hypertension: Secondary | ICD-10-CM | POA: Insufficient documentation

## 2023-09-22 DIAGNOSIS — R0789 Other chest pain: Secondary | ICD-10-CM | POA: Insufficient documentation

## 2023-09-22 DIAGNOSIS — R079 Chest pain, unspecified: Secondary | ICD-10-CM

## 2023-09-22 LAB — TROPONIN I (HIGH SENSITIVITY)
Troponin I (High Sensitivity): 4 ng/L (ref <18)
Troponin I (High Sensitivity): 4 ng/L (ref <18)

## 2023-09-22 LAB — BASIC METABOLIC PANEL WITH GFR
Anion gap: 9 (ref 5–15)
BUN: 9 mg/dL (ref 6–20)
CO2: 23 mmol/L (ref 22–32)
Calcium: 9.1 mg/dL (ref 8.9–10.3)
Chloride: 106 mmol/L (ref 98–111)
Creatinine, Ser: 0.89 mg/dL (ref 0.61–1.24)
GFR, Estimated: >60 mL/min (ref >60)
Glucose, Bld: 100 mg/dL — ABNORMAL HIGH (ref 70–99)
Potassium: 3.7 mmol/L (ref 3.5–5.1)
Sodium: 138 mmol/L (ref 135–145)

## 2023-09-22 LAB — CBC
HCT: 39.6 % (ref 39.0–52.0)
Hemoglobin: 13.9 g/dL (ref 13.0–17.0)
MCH: 35.7 pg — ABNORMAL HIGH (ref 26.0–34.0)
MCHC: 35.1 g/dL (ref 30.0–36.0)
MCV: 101.8 fL — ABNORMAL HIGH (ref 80.0–100.0)
Platelets: 281 10*3/uL (ref 150–400)
RBC: 3.89 MIL/uL — ABNORMAL LOW (ref 4.22–5.81)
RDW: 14 % (ref 11.5–15.5)
WBC: 8.9 10*3/uL (ref 4.0–10.5)
nRBC: 0 % (ref 0.0–0.2)

## 2023-09-22 NOTE — Discharge Instructions (Signed)
 Follow up with cardiology. Return to the ER for worsening or concerning symptoms.  Cocaine can cause heart attacks.

## 2023-09-22 NOTE — ED Notes (Signed)
 This pt has been outside several times while waiting to be seen.  Just wanted to note.
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
## 2023-09-22 NOTE — ED Triage Notes (Signed)
 Pt reports chest pain x3 days into back, also did cocaine at that time. Tight breathing. ETOH yesterday
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
## 2024-03-01 DIAGNOSIS — R072 Precordial pain: Secondary | ICD-10-CM | POA: Insufficient documentation
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
## 2024-03-02 ENCOUNTER — Encounter: Payer: Self-pay | Admitting: Cardiology

## 2024-03-02 ENCOUNTER — Telehealth: Payer: Self-pay

## 2024-03-02 ENCOUNTER — Ambulatory Visit: Attending: Cardiology | Admitting: Cardiology

## 2024-03-02 VITALS — BP 147/79 | HR 82 | Resp 16 | Ht 69.0 in | Wt 166.0 lb

## 2024-03-02 DIAGNOSIS — R072 Precordial pain: Secondary | ICD-10-CM | POA: Diagnosis not present

## 2024-03-02 NOTE — Patient Instructions (Signed)
 Medication Instructions:  Your physician recommends that you continue on your current medications as directed. Please refer to the Current Medication list given to you today.  *If you need a refill on your cardiac medications before your next appointment, please call your pharmacy*  Lab Work: Mag, BMET today If you have labs (blood work) drawn today and your tests are completely normal, you will receive your results only by: MyChart Message (if you have MyChart) OR A paper copy in the mail If you have any lab test that is abnormal or we need to change your treatment, we will call you to review the results.  Testing/Procedures: NONE  Follow-Up: At [facility], you and your health needs are our priority.  As part of our continuing mission to provide you with exceptional heart care, our providers are all part of one team.  This team includes your primary Cardiologist (physician) and Advanced Practice Providers or APPs (Physician Assistants and Nurse Practitioners) who all work together to provide you with the care you need, when you need it.  Your next appointment:   As needed  Provider:   Lavona, MD  We recommend signing up for the patient portal called MyChart.  Sign up information is provided on this After Visit Summary.  MyChart is used to connect with patients for Virtual Visits (Telemedicine).  Patients are able to view lab/test results, encounter notes, upcoming appointments, etc.  Non-urgent messages can be sent to your provider as well.   To learn more about what you can do with MyChart, go to [email or web address].

## 2024-03-02 NOTE — Telephone Encounter (Signed)
 Pt called to see if he would make it to his appointment. Pt stated he would be 45 minutes late. Pt asked to rescheduled. Office number sent in MyChart per pt's request. Pt verbalized understanding. All questions if any were answered.

## 2024-05-08 ENCOUNTER — Emergency Department (HOSPITAL_COMMUNITY): Admission: EM | Admit: 2024-05-08 | Discharge: 2024-05-08 | Disposition: A | Discharge status: Home / Self Care

## 2024-05-08 DIAGNOSIS — R059 Cough, unspecified: Secondary | ICD-10-CM | POA: Diagnosis present

## 2024-05-08 DIAGNOSIS — J069 Acute upper respiratory infection, unspecified: Secondary | ICD-10-CM

## 2024-05-08 DIAGNOSIS — J101 Influenza due to other identified influenza virus with other respiratory manifestations: Secondary | ICD-10-CM | POA: Insufficient documentation

## 2024-05-08 DIAGNOSIS — F17200 Nicotine dependence, unspecified, uncomplicated: Secondary | ICD-10-CM | POA: Insufficient documentation

## 2024-05-08 LAB — RESP PANEL BY RT-PCR (RSV, FLU A&B, COVID)  RVPGX2
Influenza A by PCR: POSITIVE — AB
Influenza B by PCR: NEGATIVE
Resp Syncytial Virus by PCR: NEGATIVE
SARS Coronavirus 2 by RT PCR: NEGATIVE

## 2024-05-08 LAB — GROUP A STREP BY PCR: Group A Strep by PCR: NOT DETECTED

## 2024-05-08 MED ORDER — OSELTAMIVIR PHOSPHATE 75 MG PO CAPS: 75.0000 mg | ORAL_CAPSULE | Freq: Two times a day (BID) | ORAL | 0 refills | Status: AC

## 2024-05-08 MED ORDER — BENZONATATE 100 MG PO CAPS: 100.0000 mg | ORAL_CAPSULE | Freq: Three times a day (TID) | ORAL | 0 refills | Status: AC | PRN

## 2024-05-08 NOTE — ED Triage Notes (Signed)
 Pt c/o 10 days of sore throat, cough, congestion, body aches, fever.

## 2024-05-08 NOTE — ED Provider Notes (Signed)
 " [place] EMERGENCY DEPARTMENT AT [facility] Provider Note   CSN: [id number] Arrival date & time: 05/08/24  1015     Patient presents with: URI and Sore Throat   Jamie Lowe is a 38 y.o. male who presents to the ED with flulike symptoms that began 3 days ago. The symptoms include a productive cough, fevers, myalgias, sore throat, nasal and chest congestion.  Patient states that he has been using over-the-counter cold/flu medication with minimal relief.  Patient denies any chest pain or shortness of breath.  Patient denies any nausea, vomiting, or diarrhea. No recent travel. No sick contacts. The patients social history is notable for current everyday smoker.  Patient is in no acute distress.    URI Presenting symptoms: cough   Sore Throat       Prior to Admission medications  Medication Sig Start Date End Date Taking? Authorizing Provider  benzonatate  (TESSALON ) 100 MG capsule Take 1 capsule (100 mg total) by mouth 3 (three) times daily as needed for up to 7 days for cough. 05/08/24 05/15/24 Yes Maryclaire Stoecker L, PA  oseltamivir  (TAMIFLU ) 75 MG capsule Take 1 capsule (75 mg total) by mouth 2 (two) times daily for 5 days. 05/08/24 05/13/24 Yes Vetra Shinall L, PA  acetaminophen  (TYLENOL ) 500 MG tablet Take 2 tablets (1,000 mg total) by mouth every 6 (six) hours as needed for mild pain (pain score 1-3). 03/12/23   Rosalba Glendale DEL, PA-C  docusate sodium  (COLACE) 100 MG capsule Take 1 capsule (100 mg total) by mouth 2 (two) times daily as needed for mild constipation or moderate constipation. Patient not taking: Reported on 03/02/2024 03/12/23   Rosalba Glendale DEL, PA-C  doxycycline  (VIBRAMYCIN ) 100 MG capsule Take 1 capsule (100 mg total) by mouth 2 (two) times daily. Patient not taking: Reported on 03/02/2024 07/24/22   Ruthell Lonni FALCON, PA-C  lidocaine  (LIDODERM ) 5 % Place 1 patch onto the skin daily. Remove & Discard patch within 12 hours or as directed by MD Patient  not taking: Reported on 03/02/2024 02/13/22   Redwine, Madison A, PA-C  methocarbamol  (ROBAXIN ) 500 MG tablet Take 1 tablet (500 mg total) by mouth 2 (two) times daily. Patient not taking: Reported on 03/02/2024 02/13/22   Redwine, Madison A, PA-C  naproxen  (NAPROSYN ) 500 MG tablet Take 1 tablet (500 mg total) by mouth 2 (two) times daily. Patient not taking: Reported on 03/02/2024 02/13/22   Redwine, Madison A, PA-C  polyethylene glycol (MIRALAX  / GLYCOLAX ) 17 g packet Take 17 g by mouth daily as needed (constipation). Patient not taking: Reported on 03/02/2024 03/12/23   Rosalba Glendale DEL, PA-C    Allergies: Patient has no known allergies.    Review of Systems  Respiratory:  Positive for cough.     Updated Vital Signs BP (!) 140/102   Pulse 86   Temp 98.1 F (36.7 C) (Oral)   Resp 18   SpO2 100%   Physical Exam Vitals and nursing note reviewed.  Constitutional:      General: He is awake. He is not in acute distress.    Appearance: Normal appearance. He is not toxic-appearing.  HENT:     Head: Normocephalic and atraumatic.     Right Ear: Hearing and ear canal normal.     Left Ear: Hearing and ear canal normal.     Ears:     Comments: TM mildly erythematous bilaterally without bulging, effusion, purulence, or loss of landmarks.     Nose: Congestion and  rhinorrhea present. Rhinorrhea is clear.     Right Sinus: Maxillary sinus tenderness present.     Left Sinus: Maxillary sinus tenderness present.     Mouth/Throat:     Mouth: Mucous membranes are moist.     Pharynx: Uvula midline. Posterior oropharyngeal erythema present. No pharyngeal swelling, oropharyngeal exudate or uvula swelling.     Tonsils: No tonsillar exudate or tonsillar abscesses.  Eyes:     General: Lids are normal. Vision grossly intact.     Extraocular Movements: Extraocular movements intact.     Conjunctiva/sclera: Conjunctivae normal.     Pupils: Pupils are equal, round, and reactive to light.   Cardiovascular:     Rate and Rhythm: Normal rate and regular rhythm.     Pulses: Normal pulses.          Radial pulses are 2+ on the right side.  Pulmonary:     Effort: Pulmonary effort is normal. No respiratory distress.     Breath sounds: Normal breath sounds. No wheezing.     Comments: Patient has no difficulty speaking in complete sentences.  Abdominal:     General: Abdomen is flat.     Palpations: Abdomen is soft.     Tenderness: There is no abdominal tenderness.  Musculoskeletal:        General: Normal range of motion.     Cervical back: Full passive range of motion without pain, normal range of motion and neck supple. No rigidity. No spinous process tenderness.  Skin:    General: Skin is warm and dry.     Capillary Refill: Capillary refill takes less than 2 seconds.     Findings: No rash.  Neurological:     General: No focal deficit present.     Mental Status: He is alert. Mental status is at baseline.  Psychiatric:        Attention and Perception: Attention normal.        Mood and Affect: Mood normal.        Speech: Speech normal.     (all labs ordered are listed, but only abnormal results are displayed) Labs Reviewed  RESP PANEL BY RT-PCR (RSV, FLU A&B, COVID)  [id number] - Abnormal; Notable for the following components:      Result Value   Influenza A by PCR POSITIVE (*)    All other components within normal limits  GROUP A STREP BY PCR    EKG: None  Radiology: No results found.   Procedures   Medications Ordered in the ED - No data to display                                Medical Decision Making Amount and/or Complexity of Data Reviewed Labs:  Decision-making details documented in ED Course.   Patient presents to the ED for: flu-like symptoms  This involves an extensive number of treatment options Differential diagnosis includes: Infectious etiology Co-morbid conditions: None  Clinical Course as of 05/08/24 2133  Tue May 08, 2024  1237 Temp:  98.1 F (36.7 C) Afebrile, vital stable, patient in no acute distress [ML]  1237 Group A Strep by PCR if patient complains of sore throat. Negative [ML]  1237 Resp panel by RT-PCR (RSV, Flu A&B, Covid) Anterior Nasal Swab(!) Flu A+ [ML]    Clinical Course User Index [ML] Willma Duwaine CROME, PA    Data Reviewed / Actions Taken: Labs ordered/reviewed with my independent interpretation in  ED course above.  Test Considered/Diagnostic tools:  Additional diagnostic testing such as further laboratory studies and imaging were considered, however, based on the patients presenting symptoms and initial clinical assessment they were deemed not necessary at this time.  ED Course / Reassessments: Problem List: Influenza A 38 year old male presented for flulike symptoms. Initial assessment included history, physical exam, and review of prior medical records. Laboratory testing was obtained given clinical presentation and viral PCR testing returned positive for influenza A.  There is low clinical suspicion for bacterial etiology requiring antibiotics based on positive PCR testing, reassuring vital signs, and overall clinical appearance. Imaging and additional laboratory studies were not indicated at this time. Antiviral therapy was discussed, including risks and benefit and was initiated based on symptom duration, risk factors, and shared decision making.  Patient also prescribed a short course of benzonatate  for cough suppressant. The patient remained stable during the ED course and was deemed appropriate for outpatient management.  Discharge planning include strict return precautions for worsening respiratory symptoms, persistent high fevers, chest pain, inability to tolerate oral intake, or new neurological symptoms.  The patient was advised on continued supportive care at home, including hydration, rest, and antipyretic use, as well as isolation precautions to limit transmission.   Disposition: Disposition:  Discharge with close follow-up with PCP for further evaluation and care. Rationale for disposition: stable for discharge. The disposition plan and rationale were discussed with the patient at the bedside, all questions were addressed, and the patient demonstrated understanding.  This note was produced using Electronics Engineer. While I have reviewed and verified all clinical information, transcription errors may remain.      Final diagnoses:  Viral upper respiratory illness    ED Discharge Orders          Ordered    benzonatate  (TESSALON ) 100 MG capsule  3 times daily PRN        05/08/24 1259    oseltamivir  (TAMIFLU ) 75 MG capsule  2 times daily        05/08/24 1259               Willma Duwaine CROME, [place] 05/08/24 2139    Neysa Caron PARAS, DO 05/09/24 [id number]  "

## 2024-05-08 NOTE — Discharge Instructions (Addendum)
 Thank you for visiting the Emergency Department today.  It was a pleasure to be part of your healthcare team.  Your testing was positive for influenza.  You have been prescribed Tamiflu  and benzonatate  for symptomatic relief-if you have any questions about your medicines, please call your pharmacy or healthcare provider. At home, rest, hydrate, resume normal diet, and take Tylenol  and ibuprofen  as needed for fever/pain relief. It is important to watch for warning signs such as worsening pain, fever, trouble breathing, or chest pain. If any of these happen, return to the Emergency Department or call 911. Please follow up with your primary care provider within 2 weeks for reevaluation.  Thank you for trusting us  with your health.
